# Patient Record
Sex: Male | Born: 1950
Health system: Southern US, Community
[De-identification: ages and names within clinical notes are randomized; demographics above are authoritative.]

## PROBLEM LIST (undated history)

## (undated) DIAGNOSIS — R1909 Other intra-abdominal and pelvic swelling, mass and lump: Secondary | ICD-10-CM

## (undated) DIAGNOSIS — R002 Palpitations: Secondary | ICD-10-CM

## (undated) DIAGNOSIS — Z85820 Personal history of malignant melanoma of skin: Secondary | ICD-10-CM

## (undated) DIAGNOSIS — D696 Thrombocytopenia, unspecified: Secondary | ICD-10-CM

## (undated) DIAGNOSIS — I491 Atrial premature depolarization: Secondary | ICD-10-CM

## (undated) DIAGNOSIS — K5792 Diverticulitis of intestine, part unspecified, without perforation or abscess without bleeding: Secondary | ICD-10-CM

## (undated) DIAGNOSIS — I2510 Atherosclerotic heart disease of native coronary artery without angina pectoris: Secondary | ICD-10-CM

## (undated) DIAGNOSIS — Z7960 Long term (current) use of unspecified immunomodulators and immunosuppressants: Secondary | ICD-10-CM

## (undated) DIAGNOSIS — M0579 Rheumatoid arthritis with rheumatoid factor of multiple sites without organ or systems involvement: Secondary | ICD-10-CM

## (undated) DIAGNOSIS — I4891 Unspecified atrial fibrillation: Secondary | ICD-10-CM

## (undated) DIAGNOSIS — C801 Malignant (primary) neoplasm, unspecified: Secondary | ICD-10-CM

## (undated) DIAGNOSIS — E785 Hyperlipidemia, unspecified: Secondary | ICD-10-CM

## (undated) DIAGNOSIS — R739 Hyperglycemia, unspecified: Secondary | ICD-10-CM

## (undated) DIAGNOSIS — T7840XA Allergy, unspecified, initial encounter: Secondary | ICD-10-CM

## (undated) DIAGNOSIS — Z9289 Personal history of other medical treatment: Secondary | ICD-10-CM

## (undated) DIAGNOSIS — E119 Type 2 diabetes mellitus without complications: Secondary | ICD-10-CM

## (undated) HISTORY — PX: TONSILLECTOMY: SUR1361

## (undated) HISTORY — PX: POLYPECTOMY: SHX149

## (undated) HISTORY — DX: Palpitations: R00.2

## (undated) HISTORY — DX: Personal history of other medical treatment: Z92.89

## (undated) HISTORY — DX: Diverticulitis of intestine, part unspecified, without perforation or abscess without bleeding: K57.92

## (undated) HISTORY — DX: Unspecified atrial fibrillation: I48.91

## (undated) HISTORY — PX: WISDOM TOOTH EXTRACTION: SHX21

## (undated) HISTORY — DX: Hyperlipidemia, unspecified: E78.5

## (undated) HISTORY — DX: Type 2 diabetes mellitus without complications: E11.9

## (undated) HISTORY — DX: Malignant (primary) neoplasm, unspecified: C80.1

## (undated) HISTORY — DX: Allergy, unspecified, initial encounter: T78.40XA

## (undated) HISTORY — PX: COLONOSCOPY: SHX174

## (undated) HISTORY — DX: Hyperglycemia, unspecified: R73.9

---

## 2005-05-24 ENCOUNTER — Inpatient Hospital Stay (HOSPITAL_COMMUNITY): Admission: EM | Admit: 2005-05-24 | Discharge: 2005-05-24 | Payer: Self-pay | Admitting: Emergency Medicine

## 2007-05-01 ENCOUNTER — Ambulatory Visit (HOSPITAL_BASED_OUTPATIENT_CLINIC_OR_DEPARTMENT_OTHER): Admission: RE | Admit: 2007-05-01 | Discharge: 2007-05-01 | Payer: Self-pay | Admitting: General Surgery

## 2007-05-01 ENCOUNTER — Encounter (INDEPENDENT_AMBULATORY_CARE_PROVIDER_SITE_OTHER): Payer: Self-pay | Admitting: General Surgery

## 2007-08-29 ENCOUNTER — Ambulatory Visit: Payer: Self-pay | Admitting: Internal Medicine

## 2007-09-12 ENCOUNTER — Ambulatory Visit: Payer: Self-pay | Admitting: Internal Medicine

## 2007-09-12 ENCOUNTER — Encounter: Payer: Self-pay | Admitting: Gastroenterology

## 2010-12-22 NOTE — Op Note (Signed)
NAMEDRAYKE, GRABEL              ACCOUNT NO.:  [id number]   MEDICAL RECORD NO.:  [id number]          PATIENT TYPE:  AMB   LOCATION:  DSC                          FACILITY:  [facility]   PHYSICIAN:  Gabrielle Dare. Janee Morn, M.D.DATE OF BIRTH:  [birth year]   DATE OF PROCEDURE:  05/01/2007  DATE OF DISCHARGE:                               OPERATIVE REPORT   PREOPERATIVE DIAGNOSIS:  Mass right forearm.   POSTOPERATIVE DIAGNOSIS:  Mass right forearm.   PROCEDURE:  Excision of mass right forearm.   SURGEON:  Gabrielle Dare. Janee Morn, M.D.   ANESTHESIA:  MAC.   HISTORY OF PRESENT ILLNESS:  Mr. Lindstrom is a 60 year old gentleman I  evaluated in the office initially on April 20, 2007 for a lesion on  his right forearm.  A limited incision and drainage was done, as it  appeared to be possibly an infected sebaceous cyst.  I followed him up  on April 27, 2007 for a recheck, and the lesion had actually gotten  larger, up to about 1.5 cm, from the previous week.  I was very  concerned this may represent some form of neoplasm, including the  possibility of keratoacanthoma, basil cell carcinoma, or squamous cell  carcinoma, so I scheduled him urgently for excision of this mass.   PROCEDURE IN DETAIL:  Informed consent was obtained.  The patient was  identified in the preop holding area.  His lesion was marked.  It had  actually grown to about 2 cm in size over the last 4-5 days.  He  received intravenous antibiotics.  He was brought to the operating room.  MAC anesthesia was administered by the anesthesia staff.  His right  forearm was prepped and draped in a sterile fashion.  A mixture of 1%  lidocaine with epinephrine and 0.25% Marcaine was injected around the  site, and an elliptical incision was created to facilitate closure and  completely excise this mass.  The excision was taken down to the  underlying fascia, and this was excised in one piece.  It was oriented  for pathology with a silk  suture and passed off.  The wound was  irrigated.  Hemostasis was obtained.  Small flaps were gently raised in  the subcutaneous plane medially and laterally.  The wound was again  irrigated, and after ensuring hemostasis it was closed with interrupted  4-0 nylon sutures.  It was closed without significant tension.  There  was excellent pulse and perfusion distally in the hand.  Sponge, needle,  and instrument counts were all correct. Xeroform and a sterile dressing  were applied.  The patient tolerated the procedure well, without  apparent complication, and was taken to the recovery room in stable  condition.      Gabrielle Dare Janee Morn, M.D.  Electronically Signed    BET/MEDQ  D:  05/01/2007  T:  05/02/2007  Job:  [id number]   cc:   Hal T. Stoneking, M.D.

## 2010-12-25 NOTE — H&P (Signed)
NAMEDEMETRIOUS, RAINFORD             ACCOUNT NO.:  [id number]   MEDICAL RECORD NO.:  [id number]          PATIENT TYPE:  INP   LOCATION:  1823                         FACILITY:  [facility]   PHYSICIAN:  Hal T. Stoneking, M.D. DATE OF BIRTH:  [birth year]   DATE OF ADMISSION:  05/23/2005  DATE OF DISCHARGE:                                HISTORY & PHYSICAL   IDENTIFYING DATA:  Mr. Lampkins is a very nice healthy 60 year old white  male.  He was in his usual state of health until last evening when he had  been at a church function and then to a dinner.  He felt bloated, and  eventually had an episode of very watery diarrhea, at least on 2 occasions.  He saw no blood, had no fever.  He had 3 separate syncopal episodes; one  occurred when he was driving his car.  He pulled over to the side, and in  fact scraped the bumper after the process was all over.  He came to in the  car.  He believes that he was out only a few minutes.  He felt well enough  to then get back home.  He had another episode while sitting on the commode  and felt that his head just nodded down for a few seconds.  He has had no  recent foreign travel or drinking any untreated water.  No recent  antibiotics.  He had no chest pain during any of these episodes.  No actual  vomiting, but each time he has felt nauseated and continues to feel  nauseated.  He actually had 1 syncopal episode, quite diaphoretic, in the  emergency room with his pressure dropping down to 80, although his pulse  rate remained at 60.  He has denied any headache.   PAST MEDICAL HISTORY:  Remarkable for hypercholesterolemia, quite mild.   CURRENT MEDICATIONS:  Lipitor 10 mg every other day.   PREVIOUS SURGERIES:  Right knee surgery.   FAMILY HISTORY:  Father had an MI in her 24s.  Mother had an MI in her 58s.  Brother in good health.   SOCIAL HISTORY:  He is married.  One child.  Does not smoke.  An occasional  glass of wine.  He is a retired Psychologist, clinical.   REVIEW OF SYSTEMS:  No change in vision or hearing.  No trouble chewing or  swallowing.  No musculoskeletal pain.   PHYSICAL EXAMINATION:  VITAL SIGNS:  Temperature 98, blood pressure 138/44,  pulse 72, O2 saturation 98%.  HEENT:  Pupils equal, round and reactive to light.  Disks normal.  Oropharynx normal.  NECK:  Supple.  LUNGS:  Clear.  HEART:  Regular rate and rhythm without murmurs, rubs, or gallops.  ABDOMEN:  Bowel sounds active.  Soft abdomen.  No hepatosplenomegaly or  masses palpated.  NEUROLOGIC:  He is alert, nonfocal.   LABORATORY DATA ON ADMISSION:  CK of 438 with an MB of 3.2, troponin less  than 0.05, white count 11,500, hemoglobin 15.4, platelet count 152, 89%  neutrophils, 5% lymphs, 6% monocytes.  Sodium 138, potassium 3.8, chloride  109, bicarbonate 20, glucose 161, BUN 15, creatinine 1.2, calcium 8.9, INR  1.0.   ASSESSMENT:  1.  Clinical presentation and examination most consistent with a      gastroenteritis with vasovagal syncope secondary to nausea and diarrhea.      No signs or symptoms of cardiac disease, but will monitor on telemetry      and repeat a CK-MB in the morning.  Will also repeat his white blood      cell count, which should come down fairly quickly, if this is indeed      viral.  2.  Mild elevation in glucose.  May be due to stress and IV fluids.  Will      repeat with laboratory work in the morning.  Will place him on a clear      liquid diet.   PLAN:  Will admit.  IV fluids.  P.R.N. Phenergan.  Repeat a CPK, CBC, CMET  in the morning.           ______________________________  Sunday Spillers. Pete Glatter, M.D.     HTS/MEDQ  D:  05/24/2005  T:  05/24/2005  Job:  [id number]

## 2010-12-25 NOTE — Discharge Summary (Signed)
Alfred Thomas, Alfred Thomas             ACCOUNT NO.:  [id number]   MEDICAL RECORD NO.:  [id number]          PATIENT TYPE:  INP   LOCATION:  6713                         FACILITY:  [facility]   PHYSICIAN:  Hal T. Stoneking, M.D. DATE OF BIRTH:  [birth year]   DATE OF ADMISSION:  05/23/2005  DATE OF DISCHARGE:  05/24/2005                                 DISCHARGE SUMMARY   ADMITTING DIAGNOSES:  1.  Gastroenteritis.  2.  Vasovagal syncope.  3.  Hypercholesterolemia.   DISCHARGE DIAGNOSES:  1.  Gastroenteritis.  2.  Vasovagal syncope.  3.  Hypercholesterolemia.   BRIEF HISTORY:  Mr. Mcgowan is a very nice 61 year old white male who has  really been in excellent health.  He takes Lipitor for mildly elevated  cholesterol.  On the evening of admission he felt somewhat bloated after he  had been at a dinner party.  He then had driven home, but developed  sweating, severe nausea, and then had a syncopal episode. Fortunately he had  pulled his car over to the curb.  He then returned home, had two diarrheal  stools, again felt extremely nauseated, and believed he passed out, again,  for a few seconds.  He was seen in the emergency room and had a third  episode with diffuse diaphoresis, a severe wave of nausea; and, at this  time, briefly was unresponsive with the blood pressure dropping to 80, pulse  rate 60.   PHYSICAL EXAMINATION:  VITAL SIGNS:  Blood pressure 138/44, temperature 98,  pulse 72, O2 saturation 98%.  HEENT:  Normal.  LUNGS:  Clear.  HEART:  Regular rate and rhythm without murmur.  ABDOMEN:  Bowel sounds active, nontender.  Soft, no masses.   LABORATORY DATA:  On admission, CK 438 with an MB of 3.2.  Troponin less  than 0.05.  White count elevated at 11,500 with a left shift.  Hemoglobin  15.4, sodium 138, potassium 3.8, chloride 109, BUN 15, creatinine 1.2,  glucose 161, INR 1.0.  EKG normal.   HOSPITAL COURSE:  Patient was admitted to a telemetry bed and was given  vigorous  IV fluid hydration and Phenergan p.r.n. for nausea.  Within about 8  hours he felt much better and was able to take in a liquid diet and had no  further nausea, had no further diaphoresis or syncopal episodes.  Repeat  blood work revealed his CK had come down to normal with an MB of 29,  troponin less than 0.05.  Sodium 137, potassium 3.7, chloride 108, bicarb  22, BUN 16, creatinine 1.6.  White count still elevated at 12,000.  LFTs  were normal.  Temperature 98.7, blood pressure 123/73.  His abdominal exam  was normal.  It was felt that he had either a viral or bacterial  gastroenteritis that was improving with hydration and that he had vasovagal  syncope.   PLAN:  He will restart his Lipitor 10 mg every other day. He will adhere to  a bland diet with no milk products for 24 hours.  He will use Phenergan, as  needed for the nausea.  He will be  seen back in the office later this week.  At that time we will repeat a white count.  He will call me in the meantime  if he should have any further symptoms.           ______________________________  Sunday Spillers Pete Glatter, M.D.     HTS/MEDQ  D:  05/24/2005  T:  05/24/2005  Job:  [id number]

## 2011-05-20 LAB — POCT HEMOGLOBIN-HEMACUE
Hemoglobin: 14.5
Operator id: 112821

## 2012-06-21 ENCOUNTER — Other Ambulatory Visit: Payer: Self-pay | Admitting: Dermatology

## 2012-09-06 ENCOUNTER — Encounter: Payer: Self-pay | Admitting: Internal Medicine

## 2012-12-19 ENCOUNTER — Other Ambulatory Visit: Payer: Self-pay | Admitting: Dermatology

## 2013-03-06 ENCOUNTER — Encounter: Payer: Self-pay | Admitting: Internal Medicine

## 2013-07-03 ENCOUNTER — Telehealth: Payer: Self-pay

## 2013-07-04 ENCOUNTER — Other Ambulatory Visit: Payer: Self-pay

## 2013-07-04 ENCOUNTER — Telehealth: Payer: Self-pay

## 2013-07-04 MED ORDER — DILTIAZEM HCL ER COATED BEADS 180 MG PO CP24: 180.0000 mg | ORAL_CAPSULE | Freq: Every day | ORAL | Status: DC

## 2013-07-04 NOTE — Telephone Encounter (Signed)
unable to notify pt refill for diltiazem sent to pharmacy. phone disconnected

## 2013-07-04 NOTE — Telephone Encounter (Signed)
Done

## 2013-07-06 NOTE — Telephone Encounter (Signed)
refill 

## 2013-07-12 ENCOUNTER — Other Ambulatory Visit: Payer: Self-pay | Admitting: Cardiology

## 2013-07-12 MED ORDER — DILTIAZEM HCL ER COATED BEADS 180 MG PO CP24: 180.0000 mg | ORAL_CAPSULE | Freq: Every day | ORAL | Status: DC

## 2013-07-12 NOTE — Telephone Encounter (Signed)
Rx refilled.

## 2013-11-22 ENCOUNTER — Encounter: Payer: Self-pay | Admitting: *Deleted

## 2014-03-22 ENCOUNTER — Encounter: Payer: Self-pay | Admitting: Cardiology

## 2014-03-22 ENCOUNTER — Ambulatory Visit (INDEPENDENT_AMBULATORY_CARE_PROVIDER_SITE_OTHER): Payer: Medicare PPO | Admitting: Cardiology

## 2014-03-22 VITALS — BP 110/80 | HR 65 | Ht 76.0 in | Wt 206.0 lb

## 2014-03-22 DIAGNOSIS — E7800 Pure hypercholesterolemia, unspecified: Secondary | ICD-10-CM | POA: Insufficient documentation

## 2014-03-22 DIAGNOSIS — I4891 Unspecified atrial fibrillation: Secondary | ICD-10-CM

## 2014-03-22 DIAGNOSIS — I480 Paroxysmal atrial fibrillation: Secondary | ICD-10-CM

## 2014-03-22 NOTE — Patient Instructions (Signed)
The current medical regimen is effective;  continue present plan and medications.  Follow up in 1 year with Dr Skains.  You will receive a letter in the mail 2 months before you are due.  Please call us when you receive this letter to schedule your follow up appointment.  

## 2014-03-22 NOTE — Progress Notes (Signed)
[place]. [address] [place], [place]  [address] Phone: [phone] Fax:  [phone]  Date:  03/22/2014   ID:  Alfred Thomas, DOB September 08, 1950, MRN [id number]  PCP:  Mathews Argyle, MD   History of Present Illness: Alfred Thomas is a 63 y.o. male with hyperlipidemia, paroxysmal atrial fibrillation, normal EF here for followup.   His atrial fibrillation was very briefly detected on the monitor of approximately 3 minutes duration.   As above, he usually has brief episodes. Since increasing his medication back to 180 mg a day of diltiazem from 120 mg, he has not had any episodes. He did tell me that while working out at [employer] a stranger told him that he was breathing incorrectly during weight lifting. He thinks that changing his breathing patterns is also help with any fluttering sensation that may have occurred during exercise. It is very plausible that atrial stretch may have occurred during straining.     Wt Readings from Last 3 Encounters:  03/22/14 206 lb (93.441 kg)     Past Medical History  Diagnosis Date  . Hyperlipidemia   . Hyperglycemia   . Atrial fibrillation   . H/O echocardiogram   . Diverticulitis   . Palpitations     No past surgical history on file.  Current Outpatient Prescriptions  Medication Sig Dispense Refill  . aspirin 81 MG tablet Take 81 mg by mouth daily.      . benzonatate (TESSALON) 200 MG capsule       . diltiazem (CARDIZEM CD) 180 MG 24 hr capsule Take 1 capsule (180 mg total) by mouth daily.  90 capsule  2  . fluticasone (FLONASE) 50 MCG/ACT nasal spray       . Loratadine (CLARITIN PO) Take by mouth daily.      . pravastatin (PRAVACHOL) 40 MG tablet Take 40 mg by mouth daily.      Marland Kitchen scopolamine (TRANSDERM-SCOP) 1 MG/3DAYS Place 1 patch onto the skin every 3 (three) days.       No current facility-administered medications for this visit.    Allergies:    Allergies  Allergen Reactions  . Crestor [Rosuvastatin]   .  Lipitor [Atorvastatin]     Social History:  The patient  reports that he has never smoked. He does not have any smokeless tobacco history on file. He reports that he drinks alcohol. He reports that he does not use illicit drugs.   Family History  Problem Relation Age of Onset  . Coronary artery disease Mother   . Heart attack Mother    Mother died 12-19-2022 December 04, 1989 CABG pacer.   ROS:  Please see the history of present illness.   No fevers, no chills, no orthopnea, no PND   All other systems reviewed and negative.   PHYSICAL EXAM: VS:  BP 110/80  Pulse 65  Ht 6\' 4"  (1.93 m)  Wt 206 lb (93.441 kg)  BMI 25.09 kg/m2 Well nourished, well developed, in no acute distress HEENT: normal, [place]/AT, EOMI Neck: no JVD, normal carotid upstroke, no bruit Cardiac:  normal S1, S2; RRR; no murmur Lungs:  clear to auscultation bilaterally, no wheezing, rhonchi or rales Abd: soft, nontender, no hepatomegaly, no bruits Ext: no edema, 2+ distal pulses Skin: warm and dry GU: deferred Neuro: no focal abnormalities noted, AAO x 3  EKG:  Sinus rhythm, 65, PVC, no other abnormalities.  LABS: 2022/12/19 -  LDL 143    ASSESSMENT AND PLAN:  1. PAF - doing well. Diltiazem working well. He has not had any recent episodes. CHADS-VAS - 1 -age. 2. Hyperlipidemia - pravastatin QOD.  3. He was very thankful for hospice, taking care of his 32 year old mother.  Signed, Candee Furbish, MD [facility]  03/22/2014 4:05 PM

## 2014-04-25 ENCOUNTER — Other Ambulatory Visit: Payer: Self-pay | Admitting: *Deleted

## 2014-04-25 MED ORDER — DILTIAZEM HCL ER COATED BEADS 180 MG PO CP24: 180.0000 mg | ORAL_CAPSULE | Freq: Every day | ORAL | Status: DC

## 2014-08-07 ENCOUNTER — Encounter: Payer: Self-pay | Admitting: Internal Medicine

## 2014-09-27 ENCOUNTER — Ambulatory Visit (AMBULATORY_SURGERY_CENTER): Payer: Self-pay | Admitting: *Deleted

## 2014-09-27 VITALS — Ht 76.0 in | Wt 215.6 lb

## 2014-09-27 DIAGNOSIS — Z86010 Personal history of colonic polyps: Secondary | ICD-10-CM

## 2014-09-27 MED ORDER — MOVIPREP 100 G PO SOLR: 1.0000 | Freq: Once | ORAL | Status: DC

## 2014-09-27 NOTE — Progress Notes (Signed)
No egg or soy allergy No diet pills No home 02 use No issues with past sedation Pt declined emmi PT states a fib under control with meds.

## 2014-10-11 ENCOUNTER — Ambulatory Visit (AMBULATORY_SURGERY_CENTER): Payer: Medicare PPO | Admitting: Internal Medicine

## 2014-10-11 ENCOUNTER — Encounter: Payer: Self-pay | Admitting: Internal Medicine

## 2014-10-11 VITALS — BP 137/92 | HR 57 | Temp 96.8°F | Resp 19 | Ht 76.0 in | Wt 215.0 lb

## 2014-10-11 DIAGNOSIS — D123 Benign neoplasm of transverse colon: Secondary | ICD-10-CM

## 2014-10-11 DIAGNOSIS — Z86010 Personal history of colonic polyps: Secondary | ICD-10-CM

## 2014-10-11 MED ORDER — SODIUM CHLORIDE 0.9 % IV SOLN
500.0000 mL | INTRAVENOUS | Status: DC
Start: 2014-10-11 — End: 2014-10-11

## 2014-10-11 NOTE — Op Note (Signed)
[name]. [place], [id number]   COLONOSCOPY PROCEDURE REPORT  PATIENT: Alfred Thomas, Alfred Thomas  MR#: [id number] BIRTHDATE: 1951-04-06 , 14  yrs. old GENDER: male ENDOSCOPIST: Eustace Quail, MD REFERRED YT:RZNBVAPOLIDC Program Recall PROCEDURE DATE:  10/11/2014 PROCEDURE:   Colonoscopy with snare polypectomy x 1 First Screening Colonoscopy - Avg.  risk and is 50 yrs.  old or older - No.  Prior Negative Screening - Now for repeat screening. N/A  History of Adenoma - Now for follow-up colonoscopy & has been > or = to 3 yrs.  Yes hx of adenoma.  Has been 3 or more years since last colonoscopy.  Polyps Removed Today? Yes. ASA CLASS:   Class II INDICATIONS:follow up of adenomatous colonic polyp(s). Index 2004 (hpp); 2009 (TA) MEDICATIONS: Monitored anesthesia care and Propofol 240 mg IV  DESCRIPTION OF PROCEDURE:   After the risks benefits and alternatives of the procedure were thoroughly explained, informed consent was obtained.  The digital rectal exam revealed no abnormalities of the rectum.   The LB [id number]  endoscope was introduced through the anus and advanced to the cecum, which was identified by both the appendix and ileocecal valve. No adverse events experienced.   The quality of the prep was excellent, using MoviPrep  The instrument was then slowly withdrawn as the colon was fully examined.  COLON FINDINGS: A single polyp measuring 5 mm in size was found in the transverse colon.  A polypectomy was performed with a cold snare.  The resection was complete, the polyp tissue was completely retrieved and sent to histology.   There was moderate diverticulosis noted in the left colon.   The examination was otherwise normal.  Retroflexed views revealed internal hemorrhoids. The time to cecum=2 minutes 53 seconds.  Withdrawal time=13 minutes 0 seconds.  The scope was withdrawn and the procedure completed. COMPLICATIONS: There were no immediate  complications.  ENDOSCOPIC IMPRESSION: 1.   Single polyp measuring 5 mm in size was found in the transverse colon; polypectomy was performed with a cold snare 2.   Moderate diverticulosis was noted in the left colon 3.   The examination was otherwise normal  RECOMMENDATIONS: 1. Follow up colonoscopy in 5 years  eSigned:  Eustace Quail, MD 10/11/2014 8:43 AM   cc: Lajean Manes, MD and The Patient

## 2014-10-11 NOTE — Progress Notes (Signed)
Called to room to assist during endoscopic procedure.  Patient ID and intended procedure confirmed with present staff. Received instructions for my participation in the procedure from the performing physician.  

## 2014-10-11 NOTE — Progress Notes (Signed)
To recovery, report to [facility], Therapist, sports. VSS

## 2014-10-11 NOTE — Patient Instructions (Signed)
Discharge instructions given. Handouts on polyps and diverticulosis. Resume previous medications. YOU HAD AN ENDOSCOPIC PROCEDURE TODAY AT THE [facility]:   Refer to the procedure report that was given to you for any specific questions about what was found during the examination.  If the procedure report does not answer your questions, please call your gastroenterologist to clarify.  If you requested that your care partner not be given the details of your procedure findings, then the procedure report has been included in a sealed envelope for you to review at your convenience later.  YOU SHOULD EXPECT: Some feelings of bloating in the abdomen. Passage of more gas than usual.  Walking can help get rid of the air that was put into your GI tract during the procedure and reduce the bloating. If you had a lower endoscopy (such as a colonoscopy or flexible sigmoidoscopy) you may notice spotting of blood in your stool or on the toilet paper. If you underwent a bowel prep for your procedure, you may not have a normal bowel movement for a few days.  Please Note:  You might notice some irritation and congestion in your nose or some drainage.  This is from the oxygen used during your procedure.  There is no need for concern and it should clear up in a day or so.  SYMPTOMS TO REPORT IMMEDIATELY:   Following lower endoscopy (colonoscopy or flexible sigmoidoscopy):  Excessive amounts of blood in the stool  Significant tenderness or worsening of abdominal pains  Swelling of the abdomen that is new, acute  Fever of 100F or higher   For urgent or emergent issues, a gastroenterologist can be reached at any hour by calling [phone].   DIET: Your first meal following the procedure should be a small meal and then it is ok to progress to your normal diet. Heavy or fried foods are harder to digest and may make you feel nauseous or bloated.  Likewise, meals heavy in dairy and vegetables can  increase bloating.  Drink plenty of fluids but you should avoid alcoholic beverages for 24 hours.  ACTIVITY:  You should plan to take it easy for the rest of today and you should NOT DRIVE or use heavy machinery until tomorrow (because of the sedation medicines used during the test).    FOLLOW UP: Our staff will call the number listed on your records the next business day following your procedure to check on you and address any questions or concerns that you may have regarding the information given to you following your procedure. If we do not reach you, we will leave a message.  However, if you are feeling well and you are not experiencing any problems, there is no need to return our call.  We will assume that you have returned to your regular daily activities without incident.  If any biopsies were taken you will be contacted by phone or by letter within the next 1-3 weeks.  Please call us at [phone] if you have not heard about the biopsies in 3 weeks.    SIGNATURES/CONFIDENTIALITY: You and/or your care partner have signed paperwork which will be entered into your electronic medical record.  These signatures attest to the fact that that the information above on your After Visit Summary has been reviewed and is understood.  Full responsibility of the confidentiality of this discharge information lies with you and/or your care-partner. 

## 2014-10-14 ENCOUNTER — Telehealth: Payer: Self-pay

## 2014-10-14 NOTE — Telephone Encounter (Signed)
  Follow up Call-  Call back number 10/11/2014  Post procedure Call Back phone  # [phone]  Permission to leave phone message Yes     Patient questions:  Do you have a fever, pain , or abdominal swelling? No. Pain Score  0 *  Have you tolerated food without any problems? Yes.    Have you been able to return to your normal activities? Yes.    Do you have any questions about your discharge instructions: Diet   No. Medications  No. Follow up visit  No.  Do you have questions or concerns about your Care? No.  Actions: * If pain score is 4 or above: No action needed, pain <4.

## 2014-10-15 ENCOUNTER — Encounter: Payer: Self-pay | Admitting: Internal Medicine

## 2015-02-19 DIAGNOSIS — L570 Actinic keratosis: Secondary | ICD-10-CM | POA: Diagnosis not present

## 2015-02-19 DIAGNOSIS — C44712 Basal cell carcinoma of skin of right lower limb, including hip: Secondary | ICD-10-CM | POA: Diagnosis not present

## 2015-02-19 DIAGNOSIS — B356 Tinea cruris: Secondary | ICD-10-CM | POA: Diagnosis not present

## 2015-02-19 DIAGNOSIS — Z85828 Personal history of other malignant neoplasm of skin: Secondary | ICD-10-CM | POA: Diagnosis not present

## 2015-02-19 DIAGNOSIS — L821 Other seborrheic keratosis: Secondary | ICD-10-CM | POA: Diagnosis not present

## 2015-02-19 DIAGNOSIS — D485 Neoplasm of uncertain behavior of skin: Secondary | ICD-10-CM | POA: Diagnosis not present

## 2015-02-19 DIAGNOSIS — Z85820 Personal history of malignant melanoma of skin: Secondary | ICD-10-CM | POA: Diagnosis not present

## 2015-03-23 ENCOUNTER — Other Ambulatory Visit: Payer: Self-pay | Admitting: Cardiology

## 2015-03-26 ENCOUNTER — Other Ambulatory Visit: Payer: Self-pay | Admitting: Cardiology

## 2015-06-23 ENCOUNTER — Other Ambulatory Visit: Payer: Self-pay | Admitting: Cardiology

## 2015-06-26 ENCOUNTER — Other Ambulatory Visit: Payer: Self-pay | Admitting: Cardiology

## 2015-07-30 ENCOUNTER — Other Ambulatory Visit: Payer: Self-pay | Admitting: Cardiology

## 2015-08-12 ENCOUNTER — Emergency Department (HOSPITAL_COMMUNITY): Payer: Medicare Other

## 2015-08-12 ENCOUNTER — Emergency Department (HOSPITAL_COMMUNITY)
Admission: EM | Admit: 2015-08-12 | Discharge: 2015-08-12 | Disposition: A | Discharge status: Home / Self Care | Payer: Medicare Other | Attending: Emergency Medicine | Admitting: Emergency Medicine

## 2015-08-12 ENCOUNTER — Encounter (HOSPITAL_COMMUNITY): Payer: Self-pay | Admitting: Family Medicine

## 2015-08-12 DIAGNOSIS — E785 Hyperlipidemia, unspecified: Secondary | ICD-10-CM | POA: Insufficient documentation

## 2015-08-12 DIAGNOSIS — Z8719 Personal history of other diseases of the digestive system: Secondary | ICD-10-CM | POA: Insufficient documentation

## 2015-08-12 DIAGNOSIS — R110 Nausea: Secondary | ICD-10-CM | POA: Insufficient documentation

## 2015-08-12 DIAGNOSIS — R42 Dizziness and giddiness: Secondary | ICD-10-CM | POA: Diagnosis not present

## 2015-08-12 DIAGNOSIS — R61 Generalized hyperhidrosis: Secondary | ICD-10-CM | POA: Insufficient documentation

## 2015-08-12 DIAGNOSIS — Z7982 Long term (current) use of aspirin: Secondary | ICD-10-CM | POA: Diagnosis not present

## 2015-08-12 DIAGNOSIS — Z859 Personal history of malignant neoplasm, unspecified: Secondary | ICD-10-CM | POA: Insufficient documentation

## 2015-08-12 DIAGNOSIS — R197 Diarrhea, unspecified: Secondary | ICD-10-CM | POA: Diagnosis not present

## 2015-08-12 DIAGNOSIS — Z8679 Personal history of other diseases of the circulatory system: Secondary | ICD-10-CM | POA: Insufficient documentation

## 2015-08-12 DIAGNOSIS — Z79899 Other long term (current) drug therapy: Secondary | ICD-10-CM | POA: Insufficient documentation

## 2015-08-12 DIAGNOSIS — R079 Chest pain, unspecified: Secondary | ICD-10-CM | POA: Diagnosis present

## 2015-08-12 LAB — BASIC METABOLIC PANEL
ANION GAP: 9 (ref 5–15)
BUN: 15 mg/dL (ref 6–20)
CHLORIDE: 106 mmol/L (ref 101–111)
CO2: 23 mmol/L (ref 22–32)
CREATININE: 1.06 mg/dL (ref 0.61–1.24)
Calcium: 9.1 mg/dL (ref 8.9–10.3)
GFR calc non Af Amer: >60 mL/min (ref >60)
Glucose, Bld: 133 mg/dL — ABNORMAL HIGH (ref 65–99)
POTASSIUM: 4.3 mmol/L (ref 3.5–5.1)
SODIUM: 138 mmol/L (ref 135–145)

## 2015-08-12 LAB — CBC
HCT: 47.2 % (ref 39.0–52.0)
HEMOGLOBIN: 15.8 g/dL (ref 13.0–17.0)
MCH: 30.3 pg (ref 26.0–34.0)
MCHC: 33.5 g/dL (ref 30.0–36.0)
MCV: 90.4 fL (ref 78.0–100.0)
PLATELETS: 163 10*3/uL (ref 150–400)
RBC: 5.22 MIL/uL (ref 4.22–5.81)
RDW: 13.1 % (ref 11.5–15.5)
WBC: 15.9 10*3/uL — AB (ref 4.0–10.5)

## 2015-08-12 LAB — I-STAT TROPONIN, ED
TROPONIN I, POC: 0 ng/mL (ref 0.00–0.08)
Troponin i, poc: 0 ng/mL (ref 0.00–0.08)

## 2015-08-12 MED ORDER — METOCLOPRAMIDE HCL 5 MG/ML IJ SOLN
5.0000 mg | Freq: Once | INTRAMUSCULAR | Status: AC
  Administered 2015-08-12: 5 mg via INTRAVENOUS
  Filled 2015-08-12: qty 2

## 2015-08-12 MED ORDER — DIPHENHYDRAMINE HCL 25 MG PO CAPS
25.0000 mg | ORAL_CAPSULE | Freq: Once | ORAL | Status: AC
  Administered 2015-08-12: 25 mg via ORAL
  Filled 2015-08-12: qty 1

## 2015-08-12 MED ORDER — ALUM & MAG HYDROXIDE-SIMETH 200-200-20 MG/5ML PO SUSP
15.0000 mL | Freq: Once | ORAL | Status: AC
  Administered 2015-08-12: 15 mL via ORAL
  Filled 2015-08-12: qty 30

## 2015-08-12 MED ORDER — LIDOCAINE VISCOUS 2 % MT SOLN
15.0000 mL | Freq: Once | OROMUCOSAL | Status: AC
  Administered 2015-08-12: 15 mL via OROMUCOSAL
  Filled 2015-08-12: qty 15

## 2015-08-12 MED ORDER — ONDANSETRON 4 MG PO TBDP: ORAL_TABLET | ORAL | Status: DC

## 2015-08-12 MED ORDER — SODIUM CHLORIDE 0.9 % IV BOLUS (SEPSIS)
1000.0000 mL | Freq: Once | INTRAVENOUS | Status: AC
  Administered 2015-08-12: 1000 mL via INTRAVENOUS

## 2015-08-12 MED ORDER — ONDANSETRON HCL 4 MG/2ML IJ SOLN
4.0000 mg | Freq: Once | INTRAMUSCULAR | Status: AC
  Administered 2015-08-12: 4 mg via INTRAVENOUS
  Filled 2015-08-12: qty 2

## 2015-08-12 NOTE — ED Notes (Signed)
MD at bedside. 

## 2015-08-12 NOTE — ED Notes (Signed)
Patient reports he woke up with being nausea, mid-sternal chest pain. Pt took ASA 81mg  when the chest pain started.

## 2015-08-12 NOTE — ED Notes (Signed)
Order for Istat to be drawn at [facility]

## 2015-08-12 NOTE — ED Provider Notes (Signed)
CSN: [id number]     Arrival date & time 08/12/15  0316 History  By signing my name below, I, Hansel Feinstein, attest that this documentation has been prepared under the direction and in the presence of Deno Etienne, DO. Electronically Signed: Hansel Feinstein, ED Scribe. 08/12/2015. 4:04 AM.   Chief Complaint  Patient presents with  . Chest Pain   Patient is a 65 y.o. male presenting with chest pain. The history is provided by the patient. No language interpreter was used.  Chest Pain Pain location:  Substernal area Pain quality: dull   Pain radiates to:  Does not radiate Pain radiates to the back: no   Pain severity:  Mild Onset quality:  Sudden Duration:  2 minutes Timing:  Intermittent Progression:  Improving Chronicity:  Recurrent Context: at rest   Context: no movement and no trauma   Relieved by:  None tried Worsened by:  Nothing tried Ineffective treatments:  None tried Associated symptoms: diaphoresis and nausea   Associated symptoms: no abdominal pain, no fever, no headache, no palpitations, no shortness of breath and not vomiting   Risk factors: high cholesterol     HPI Comments: Alfred Thomas. is a 65 y.o. male with h/o HLD, Afib, palpitations who presents to the Emergency Department complaining of multiple episodes, lasting minutes at a time, of sudden onset nausea, transient dull CP, transient lightheadedness, transient diaphoresis and retching that began tonight. Pt reports that he woke up with his symptoms. He notes that his symptoms are not exertional. Per pt, he is not sure what triggers the episodes of nausea, diaphoresis, lightheadedness and retching. He also notes an episode of loose stool tonight. He reports that he currently feels well and his symptoms have mostly resolved. Pt states h/o similar symptoms with syncope, with dx of food poisoning. Pt denies having any sick contacts with similar symptoms, recent suspicious food intake, abdominal surgeries, recent travel  outside the country. He denies emesis, syncope, abdominal pain. Pt is followed by Cardiology. No h/o MI, CAD/CHF, cardiac catheterization. H/o cardiac stress test with normal findings.     Past Medical History  Diagnosis Date  . Hyperlipidemia   . Hyperglycemia   . Atrial fibrillation ([place])   . H/O echocardiogram   . Diverticulitis   . Palpitations   . Cancer [facility])     melanoma skin cancer shoulder   Past Surgical History  Procedure Laterality Date  . Colonoscopy      x2  . Polypectomy    . Wisdom tooth extraction    . Tonsillectomy     Family History  Problem Relation Age of Onset  . Coronary artery disease Mother   . Heart attack Mother   . Heart disease Mother   . Heart disease Father   . Colon cancer Neg Hx   . Emphysema Father    Social History  Substance Use Topics  . Smoking status: Never Smoker   . Smokeless tobacco: Never Used  . Alcohol Use: 0.0 oz/week    0 Standard drinks or equivalent per week     Comment: socially    Review of Systems  Constitutional: Positive for diaphoresis. Negative for fever and chills.  HENT: Negative for congestion and facial swelling.   Eyes: Negative for discharge and visual disturbance.  Respiratory: Negative for shortness of breath.   Cardiovascular: Positive for chest pain. Negative for palpitations.  Gastrointestinal: Positive for nausea and diarrhea. Negative for vomiting and abdominal pain.  Musculoskeletal: Negative for myalgias and  arthralgias.  Skin: Negative for color change and rash.  Neurological: Positive for light-headedness. Negative for tremors, syncope and headaches.  Psychiatric/Behavioral: Negative for confusion and dysphoric mood.  All other systems reviewed and are negative.  Allergies  Crestor and Lipitor  Home Medications   Prior to Admission medications   Medication Sig Start Date End Date Taking? Authorizing Provider  aspirin EC 81 MG tablet Take 81 mg by mouth daily.   Yes Historical Provider,  MD  diltiazem (CARDIZEM CD) 180 MG 24 hr capsule TAKE 1 CAPSULE BY MOUTH ONCE DAILY 07/30/15  Yes Jerline Pain, MD  fluticasone (FLONASE) 50 MCG/ACT nasal spray Place 1-2 sprays into both nostrils daily as needed.  01/24/14  Yes Historical Provider, MD  Loratadine (CLARITIN PO) Take 10 mg by mouth daily.    Yes Historical Provider, MD  naproxen sodium (ANAPROX) 220 MG tablet Take 220 mg by mouth 2 (two) times daily as needed (for joint pain/stiffness.).    Yes Historical Provider, MD  pravastatin (PRAVACHOL) 40 MG tablet Take 40 mg by mouth daily.   Yes Historical Provider, MD  ondansetron (ZOFRAN ODT) 4 MG disintegrating tablet 4mg  ODT q4 hours prn nausea/vomit 08/12/15   Deno Etienne, DO   BP 102/63 mmHg  Pulse 74  Temp(Src) 97.6 F (36.4 C) (Oral)  Resp 22  Ht 6\' 4"  (1.93 m)  Wt 210 lb (95.255 kg)  BMI 25.57 kg/m2  SpO2 95% Physical Exam  Constitutional: He is oriented to person, place, and time. He appears well-developed and well-nourished.  HENT:  Head: Normocephalic and atraumatic.  Eyes: Conjunctivae and EOM are normal. Pupils are equal, round, and reactive to light.  Neck: Normal range of motion. Neck supple.  Cardiovascular: Normal rate, regular rhythm and normal heart sounds.  Exam reveals no gallop and no friction rub.   No murmur heard. Equal UE pulses. RRR  Pulmonary/Chest: Effort normal and breath sounds normal. No respiratory distress. He has no wheezes. He has no rales. He exhibits no tenderness.  Lungs CTA bilaterally.   Abdominal: He exhibits no distension. There is no tenderness. There is no rebound.  No abdominal TTP.   Musculoskeletal: Normal range of motion.  Neurological: He is alert and oriented to person, place, and time.  Skin: Skin is warm and dry.  Psychiatric: He has a normal mood and affect. His behavior is normal.  Nursing note and vitals reviewed.   ED Course  Procedures (including critical care time) DIAGNOSTIC STUDIES: Oxygen Saturation is 96% on  RA, adequate by my interpretation.    COORDINATION OF CARE: 4:00 AM Discussed treatment plan with pt at bedside and pt agreed to plan.   Labs Review Labs Reviewed  BASIC METABOLIC PANEL - Abnormal; Notable for the following:    Glucose, Bld 133 (*)    All other components within normal limits  CBC - Abnormal; Notable for the following:    WBC 15.9 (*)    All other components within normal limits  I-STAT TROPOININ, ED  Randolm Idol, ED    Imaging Review Dg Chest 2 View  08/12/2015  CLINICAL DATA:  Acute onset of substernal chest pain and nausea. Initial encounter. EXAM: CHEST  2 VIEW COMPARISON:  Chest radiograph from 05/24/2005 FINDINGS: The lungs are well-aerated and clear. There is no evidence of focal opacification, pleural effusion or pneumothorax. The heart is normal in size; the mediastinal contour is within normal limits. No acute osseous abnormalities are seen. IMPRESSION: No acute cardiopulmonary process seen. Electronically Signed  By: Garald Balding M.D.   On: 08/12/2015 03:57   I have personally reviewed and evaluated these images and lab results as part of my medical decision-making.   EKG Interpretation   Date/Time:  Tuesday August 12 2015 03:24:01 EST Ventricular Rate:  80 PR Interval:  141 QRS Duration: 105 QT Interval:  382 QTC Calculation: 441 R Axis:   84 Text Interpretation:  Sinus rhythm Borderline right axis deviation Minimal  ST depression, inferior leads No significant change since last tracing  Confirmed by Cliford Sequeira MD, DANIEL [id number]) on 08/12/2015 3:28:24 AM      MDM   Final diagnoses:  Nausea    65 yo M with a cc of diaphoresis, chest pressure, near syncope.  This has been occurring in waves and the patient has a sensation like he needs to vomit but has not yet vomited. One loose stool. Denies strange food or water sources. Denies sick contacts. Likely GI by history. Patient with no coronary artery disease. Will obtain a delta troponin second to  patient age and risk factors. Initial troponin negative.  Delta negative, patient with continues waves of nausea.  Repeat abdominal exam benign.     I have discussed the diagnosis/risks/treatment options with the patient and family and believe the pt to be eligible for discharge home to follow-up with PCP. We also discussed returning to the ED immediately if new or worsening sx occur. We discussed the sx which are most concerning (e.g., sudden worsening pain, fever, inability to tolerate by mouth) that necessitate immediate return. Medications administered to the patient during their visit and any new prescriptions provided to the patient are listed below.  Medications given during this visit Medications  sodium chloride 0.9 % bolus 1,000 mL (0 mLs Intravenous Stopped 08/12/15 0500)  ondansetron (ZOFRAN) injection 4 mg (4 mg Intravenous Given 08/12/15 0422)  alum & mag hydroxide-simeth (MAALOX/MYLANTA) 200-200-20 MG/5ML suspension 15 mL (15 mLs Oral Given 08/12/15 0424)  lidocaine (XYLOCAINE) 2 % viscous mouth solution 15 mL (15 mLs Mouth/Throat Given 08/12/15 0424)  metoCLOPramide (REGLAN) injection 5 mg (5 mg Intravenous Given 08/12/15 0636)  diphenhydrAMINE (BENADRYL) capsule 25 mg (25 mg Oral Given 08/12/15 0636)    Discharge Medication List as of 08/12/2015  6:33 AM    START taking these medications   Details  ondansetron (ZOFRAN ODT) 4 MG disintegrating tablet 4mg  ODT q4 hours prn nausea/vomit, Print        The patient appears reasonably screen and/or stabilized for discharge and I doubt any other medical condition or other [facility] requiring further screening, evaluation, or treatment in the ED at this time prior to discharge.   I personally performed the services described in this documentation, which was scribed in my presence. The recorded information has been reviewed and is accurate.     Deno Etienne, DO 08/12/15 1747

## 2015-08-12 NOTE — Discharge Instructions (Signed)
Nausea and Vomiting  Nausea means you feel sick to your stomach. Throwing up (vomiting) is a reflex where stomach contents come out of your mouth.  HOME CARE   · Take medicine as told by your doctor.  · Do not force yourself to eat. However, you do need to drink fluids.  · If you feel like eating, eat a normal diet as told by your doctor.    Eat rice, wheat, potatoes, bread, lean meats, yogurt, fruits, and vegetables.    Avoid high-fat foods.  · Drink enough fluids to keep your pee (urine) clear or pale yellow.  · Ask your doctor how to replace body fluid losses (rehydrate). Signs of body fluid loss (dehydration) include:    Feeling very thirsty.    Dry lips and mouth.    Feeling dizzy.    Dark pee.    Peeing less than normal.    Feeling confused.    Fast breathing or heart rate.  GET HELP RIGHT AWAY IF:   · You have blood in your throw up.  · You have black or bloody poop (stool).  · You have a bad headache or stiff neck.  · You feel confused.  · You have bad belly (abdominal) pain.  · You have chest pain or trouble breathing.  · You do not pee at least once every 8 hours.  · You have cold, clammy skin.  · You keep throwing up after 24 to 48 hours.  · You have a fever.  MAKE SURE YOU:   · Understand these instructions.  · Will watch your condition.  · Will get help right away if you are not doing well or get worse.     This information is not intended to replace advice given to you by your health care provider. Make sure you discuss any questions you have with your health care provider.     Document Released: 01/12/2008 Document Revised: 10/18/2011 Document Reviewed: 12/25/2010  Elsevier Interactive Patient Education ©2016 [employer] Inc[employer]

## 2015-08-13 ENCOUNTER — Emergency Department (HOSPITAL_COMMUNITY)
Admission: EM | Admit: 2015-08-13 | Discharge: 2015-08-13 | Disposition: A | Discharge status: Home / Self Care | Payer: Medicare Other | Attending: Emergency Medicine | Admitting: Emergency Medicine

## 2015-08-13 ENCOUNTER — Emergency Department (HOSPITAL_COMMUNITY): Payer: Medicare Other

## 2015-08-13 ENCOUNTER — Encounter (HOSPITAL_COMMUNITY): Payer: Self-pay | Admitting: Family Medicine

## 2015-08-13 DIAGNOSIS — E785 Hyperlipidemia, unspecified: Secondary | ICD-10-CM | POA: Diagnosis not present

## 2015-08-13 DIAGNOSIS — I480 Paroxysmal atrial fibrillation: Secondary | ICD-10-CM | POA: Insufficient documentation

## 2015-08-13 DIAGNOSIS — Z8719 Personal history of other diseases of the digestive system: Secondary | ICD-10-CM | POA: Insufficient documentation

## 2015-08-13 DIAGNOSIS — Z85828 Personal history of other malignant neoplasm of skin: Secondary | ICD-10-CM | POA: Diagnosis not present

## 2015-08-13 DIAGNOSIS — Z79899 Other long term (current) drug therapy: Secondary | ICD-10-CM | POA: Diagnosis not present

## 2015-08-13 DIAGNOSIS — Z7984 Long term (current) use of oral hypoglycemic drugs: Secondary | ICD-10-CM | POA: Diagnosis not present

## 2015-08-13 DIAGNOSIS — Z9289 Personal history of other medical treatment: Secondary | ICD-10-CM | POA: Insufficient documentation

## 2015-08-13 DIAGNOSIS — I4891 Unspecified atrial fibrillation: Secondary | ICD-10-CM | POA: Diagnosis present

## 2015-08-13 LAB — CBC
HCT: 45.2 % (ref 39.0–52.0)
HEMOGLOBIN: 15 g/dL (ref 13.0–17.0)
MCH: 29.5 pg (ref 26.0–34.0)
MCHC: 33.2 g/dL (ref 30.0–36.0)
MCV: 89 fL (ref 78.0–100.0)
PLATELETS: 136 10*3/uL — AB (ref 150–400)
RBC: 5.08 MIL/uL (ref 4.22–5.81)
RDW: 13 % (ref 11.5–15.5)
WBC: 6.8 10*3/uL (ref 4.0–10.5)

## 2015-08-13 LAB — URINE MICROSCOPIC-ADD ON

## 2015-08-13 LAB — BASIC METABOLIC PANEL
ANION GAP: 10 (ref 5–15)
BUN: 10 mg/dL (ref 6–20)
CALCIUM: 8.9 mg/dL (ref 8.9–10.3)
CO2: 22 mmol/L (ref 22–32)
CREATININE: 0.89 mg/dL (ref 0.61–1.24)
Chloride: 106 mmol/L (ref 101–111)
GLUCOSE: 109 mg/dL — AB (ref 65–99)
Potassium: 3.8 mmol/L (ref 3.5–5.1)
Sodium: 138 mmol/L (ref 135–145)

## 2015-08-13 LAB — I-STAT TROPONIN, ED: TROPONIN I, POC: 0 ng/mL (ref 0.00–0.08)

## 2015-08-13 LAB — URINALYSIS, ROUTINE W REFLEX MICROSCOPIC
BILIRUBIN URINE: NEGATIVE
GLUCOSE, UA: NEGATIVE mg/dL
KETONES UR: 15 mg/dL — AB
LEUKOCYTES UA: NEGATIVE
Nitrite: NEGATIVE
PROTEIN: NEGATIVE mg/dL
Specific Gravity, Urine: 1.024 (ref 1.005–1.030)
pH: 5.5 (ref 5.0–8.0)

## 2015-08-13 MED ORDER — SODIUM CHLORIDE 0.9 % IV BOLUS (SEPSIS)
500.0000 mL | Freq: Once | INTRAVENOUS | Status: AC
  Administered 2015-08-13: 500 mL via INTRAVENOUS

## 2015-08-13 NOTE — ED Provider Notes (Signed)
CSN: [id number]     Arrival date & time 08/13/15  1540 History   First MD Initiated Contact with Patient 08/13/15 2034     Chief Complaint  Patient presents with  . Atrial Fibrillation     (Consider location/radiation/quality/duration/timing/severity/associated sxs/prior Treatment) HPI Patient with history of proximal edge fibrillation on no anti-Coagulation of and aspirin presents with atrial fibrillation. Patient was seen in emergency department several nights ago for nausea, chest tightness and loose stool. Thought to be possibly GI in origin. Discharged home to follow up primary doctor. Saw Dr. Felipa Eth today. In Dr. Carlyle Lipa office patient was borderline hypotensive with EKG that showed atrial fibrillation with RVR into the 130s. Patient states he is feeling much better. He denies any shortness of breath or chest pain. He denies any nausea or vomiting. He has had one loose stool today. No abdominal pain. States she has missed 2 days of his Cardizem. Past Medical History  Diagnosis Date  . Hyperlipidemia   . Hyperglycemia   . Atrial fibrillation ([place])   . H/O echocardiogram   . Diverticulitis   . Palpitations   . Cancer [facility])     melanoma skin cancer shoulder   Past Surgical History  Procedure Laterality Date  . Colonoscopy      x2  . Polypectomy    . Wisdom tooth extraction    . Tonsillectomy     Family History  Problem Relation Age of Onset  . Coronary artery disease Mother   . Heart attack Mother   . Heart disease Mother   . Heart disease Father   . Colon cancer Neg Hx   . Emphysema Father    Social History  Substance Use Topics  . Smoking status: Never Smoker   . Smokeless tobacco: Never Used  . Alcohol Use: 0.0 oz/week    0 Standard drinks or equivalent per week     Comment: socially    Review of Systems  Constitutional: Negative for fever and chills.  Respiratory: Negative for shortness of breath.   Cardiovascular: Negative for chest pain, palpitations  and leg swelling.  Gastrointestinal: Negative for nausea, vomiting, abdominal pain and diarrhea.  Musculoskeletal: Negative for back pain, neck pain and neck stiffness.  Skin: Negative for rash and wound.  Neurological: Negative for dizziness, weakness, light-headedness, numbness and headaches.  All other systems reviewed and are negative.     Allergies  Crestor and Lipitor  Home Medications   Prior to Admission medications   Medication Sig Start Date End Date Taking? Authorizing Provider  aspirin EC 81 MG tablet Take 81 mg by mouth daily.   Yes Historical Provider, MD  diltiazem (CARDIZEM CD) 180 MG 24 hr capsule TAKE 1 CAPSULE BY MOUTH ONCE DAILY 07/30/15  Yes Jerline Pain, MD  loratadine (CLARITIN) 10 MG tablet Take 10 mg by mouth daily.   Yes Historical Provider, MD  naproxen sodium (ANAPROX) 220 MG tablet Take 220 mg by mouth 2 (two) times daily as needed (for joint pain/stiffness.).    Yes Historical Provider, MD  pravastatin (PRAVACHOL) 40 MG tablet Take 40 mg by mouth every evening.    Yes Historical Provider, MD  ondansetron (ZOFRAN ODT) 4 MG disintegrating tablet 4mg  ODT q4 hours prn nausea/vomit 08/12/15   Deno Etienne, DO   BP 155/95 mmHg  Pulse 74  Temp(Src) 98.6 F (37 C) (Oral)  Resp 12  SpO2 97% Physical Exam  Constitutional: He is oriented to person, place, and time. He appears well-developed and well-nourished.  No distress.  HENT:  Head: Normocephalic and atraumatic.  Mouth/Throat: Oropharynx is clear and moist.  Eyes: EOM are normal. Pupils are equal, round, and reactive to light.  Neck: Normal range of motion. Neck supple.  Cardiovascular: Normal rate and regular rhythm.  Exam reveals no gallop and no friction rub.   No murmur heard. Pulmonary/Chest: Effort normal and breath sounds normal. No respiratory distress. He has no wheezes. He has no rales.  Abdominal: Soft. Bowel sounds are normal. He exhibits no distension and no mass. There is no tenderness. There  is no rebound and no guarding.  Musculoskeletal: Normal range of motion. He exhibits no edema or tenderness.  No lower extremity swelling or pain.  Neurological: He is alert and oriented to person, place, and time.  5/5 motor in all extremities. Sensation is fully intact.  Skin: Skin is warm and dry. No rash noted. No erythema.  Psychiatric: He has a normal mood and affect. His behavior is normal.  Nursing note and vitals reviewed.   ED Course  Procedures (including critical care time) Labs Review Labs Reviewed  BASIC METABOLIC PANEL - Abnormal; Notable for the following:    Glucose, Bld 109 (*)    All other components within normal limits  CBC - Abnormal; Notable for the following:    Platelets 136 (*)    All other components within normal limits  URINALYSIS, ROUTINE W REFLEX MICROSCOPIC (NOT AT [facility]) - Abnormal; Notable for the following:    APPearance CLOUDY (*)    Hgb urine dipstick MODERATE (*)    Ketones, ur 15 (*)    All other components within normal limits  URINE MICROSCOPIC-ADD ON - Abnormal; Notable for the following:    Squamous Epithelial / LPF 0-5 (*)    Bacteria, UA RARE (*)    All other components within normal limits  I-STAT TROPOININ, ED    Imaging Review Dg Chest 2 View  08/13/2015  CLINICAL DATA:  Hypotension for 1 day EXAM: CHEST  2 VIEW COMPARISON:  08/12/2015 FINDINGS: Normal mediastinum and cardiac silhouette. Normal pulmonary vasculature. No evidence of effusion, infiltrate, or pneumothorax. No acute bony abnormality. IMPRESSION: No acute cardiopulmonary process. Electronically Signed   By: Suzy Bouchard M.D.   On: 08/13/2015 17:12   Dg Chest 2 View  08/12/2015  CLINICAL DATA:  Acute onset of substernal chest pain and nausea. Initial encounter. EXAM: CHEST  2 VIEW COMPARISON:  Chest radiograph from 05/24/2005 FINDINGS: The lungs are well-aerated and clear. There is no evidence of focal opacification, pleural effusion or pneumothorax. The heart is normal  in size; the mediastinal contour is within normal limits. No acute osseous abnormalities are seen. IMPRESSION: No acute cardiopulmonary process seen. Electronically Signed   By: Garald Balding M.D.   On: 08/12/2015 03:57   I have personally reviewed and evaluated these images and lab results as part of my medical decision-making.   EKG Interpretation   Date/Time:  Wednesday August 13 2015 20:53:42 EST Ventricular Rate:  78 PR Interval:  145 QRS Duration: 101 QT Interval:  376 QTC Calculation: 428 R Axis:   85 Text Interpretation:  Sinus rhythm Atrial premature complex Borderline  right axis deviation Confirmed by Lincon Sahlin  MD, Harvy Riera ([id number]) on 08/13/2015  9:25:59 PM      MDM   Final diagnoses:  PAF (paroxysmal atrial fibrillation) (HCC)   A shows a normal sinus rhythm. Well-appearing. Given IV fluids. Discussed with Dr. Tresa Moore. Agrees with plan to discharge home to follow-up with  Dr. [name] as an outpatient.     Julianne Rice, MD 08/13/15 2235

## 2015-08-13 NOTE — Discharge Instructions (Signed)

## 2015-08-13 NOTE — ED Notes (Signed)
Pt here for afib and low BP. Sent by his doctor. Pt has no complaints.

## 2015-08-18 ENCOUNTER — Ambulatory Visit (INDEPENDENT_AMBULATORY_CARE_PROVIDER_SITE_OTHER): Payer: Medicare Other | Admitting: Cardiology

## 2015-08-18 ENCOUNTER — Encounter: Payer: Self-pay | Admitting: Cardiology

## 2015-08-18 VITALS — BP 118/80 | HR 76 | Ht 76.0 in | Wt 211.0 lb

## 2015-08-18 DIAGNOSIS — E7800 Pure hypercholesterolemia, unspecified: Secondary | ICD-10-CM | POA: Diagnosis not present

## 2015-08-18 DIAGNOSIS — R55 Syncope and collapse: Secondary | ICD-10-CM | POA: Insufficient documentation

## 2015-08-18 DIAGNOSIS — I480 Paroxysmal atrial fibrillation: Secondary | ICD-10-CM

## 2015-08-18 MED ORDER — DILTIAZEM HCL ER COATED BEADS 180 MG PO CP24: 180.0000 mg | ORAL_CAPSULE | Freq: Every day | ORAL | Status: DC

## 2015-08-18 MED ORDER — PRAVASTATIN SODIUM 40 MG PO TABS: 40.0000 mg | ORAL_TABLET | Freq: Every evening | ORAL | Status: DC

## 2015-08-18 NOTE — Progress Notes (Signed)
[name] [address], [address] [place], [place]  [address] Phone: [phone] Fax:  [phone]  Date:  08/18/2015   ID:  Alfred Field Ceth Mullady., DOB [birth year], MRN [id number]  PCP:  Mathews Argyle, MD   History of Present Illness: Alfred Salasar. is a 65 y.o. male with hyperlipidemia, paroxysmal atrial fibrillation, normal EF here for followup.   He had another episode of age or fibrillation which is noted by Dr. Felipa Eth on 08/13/15 that was rapid, tachycardia with accompanying hypotension. He was sent to the emergency department, transient lightheadedness, transient diaphoresis. He was not sure what was triggering his episodes of nausea, lightheadedness, retching, diaphoresis. Hit some loose stool. He had previous syncope with diagnosis of food poisoning. Troponin in the ER was normal. EKG in ER showed normal sinus rhythm.   His atrial fibrillation was very briefly detected on the monitor of approximately 3 minutes duration.   As above, he usually has brief episodes. Since increasing his medication back to 180 mg a day of diltiazem from 120 mg, he has not had any episodes. He did tell me that while working out at [employer] a stranger told him that he was breathing incorrectly during weight lifting. He thinks that changing his breathing patterns is also help with any fluttering sensation that may have occurred during exercise. It is very plausible that atrial stretch may have occurred during straining.     Wt Readings from Last 3 Encounters:  08/18/15 211 lb (95.709 kg)  08/12/15 210 lb (95.255 kg)  10/11/14 215 lb (97.523 kg)     Past Medical History  Diagnosis Date  . Hyperlipidemia   . Hyperglycemia   . Atrial fibrillation ([place])   . H/O echocardiogram   . Diverticulitis   . Palpitations   . Cancer [facility]     melanoma skin cancer shoulder    Past Surgical History  Procedure Laterality Date  . Colonoscopy      x2  . Polypectomy    . Wisdom tooth extraction     . Tonsillectomy      Current Outpatient Prescriptions  Medication Sig Dispense Refill  . aspirin EC 81 MG tablet Take 81 mg by mouth daily.    Marland Kitchen loratadine (CLARITIN) 10 MG tablet Take 10 mg by mouth daily.    . naproxen sodium (ANAPROX) 220 MG tablet Take 220 mg by mouth 2 (two) times daily as needed (for joint pain/stiffness.).     Marland Kitchen ondansetron (ZOFRAN ODT) 4 MG disintegrating tablet 4mg  ODT q4 hours prn nausea/vomit 20 tablet 0  . pravastatin (PRAVACHOL) 40 MG tablet Take 40 mg by mouth every evening.     . diltiazem (CARDIZEM CD) 180 MG 24 hr capsule TAKE 1 CAPSULE BY MOUTH ONCE DAILY 15 capsule 0   No current facility-administered medications for this visit.    Allergies:    Allergies  Allergen Reactions  . Crestor [Rosuvastatin] Other (See Comments)    Abn. Labs   . Lipitor [Atorvastatin] Other (See Comments)    Severe muscle aches     Social History:  The patient  reports that he has never smoked. He has never used smokeless tobacco. He reports that he drinks alcohol. He reports that he does not use illicit drugs.   Family History  Problem Relation Age of Onset  . Coronary artery disease Mother   . Heart attack Mother   . Heart disease Mother   . Heart disease Father   .  Colon cancer Neg Hx   . Emphysema Father    Mother died 12-15-2022 December 01, 1989 CABG pacer.  Father died MI - smoker  ROS:  Please see the history of present illness.   No fevers, no chills, no orthopnea, no PND   All other systems reviewed and negative.   PHYSICAL EXAM: VS:  BP 118/80 mmHg  Pulse 76  Ht 6\' 4"  (1.93 m)  Wt 211 lb (95.709 kg)  BMI 25.69 kg/m2 Well nourished, well developed, in no acute distress HEENT: normal, [place]/AT, EOMI Neck: no JVD, normal carotid upstroke, no bruit Cardiac:  normal S1, S2; RRR; no murmur Lungs:  clear to auscultation bilaterally, no wheezing, rhonchi or rales Abd: soft, nontender, no hepatomegaly, no bruits Ext: no edema, 2+ distal pulses Skin: warm and dry GU:  deferred Neuro: no focal abnormalities noted, AAO x 3  EKG:  Sinus rhythm, 65, PVC, no other abnormalities.  LABS: 2022/12/15 -  LDL 143    Echocardiogram 08/23/2007-normal ejection fraction, upper left placed atrial size, thickened mitral valve with bilaterally for prolapse, no mitral regurgitation.  Negative nuclear stress test 06/2011  ASSESSMENT AND PLAN:  1. Paroxysmal atrial fibrillation- had an episode in Dr. Carlyle Lipa office 08/13/15. This was in the setting of a stomach virus. Currently feeling better. He was hypotensive, went to the emergency room, received fluids. Improved. He has had a couple of these episodes where nausea and vomiting have been accompanied with syncope or near syncope. Typical vagal-like reaction. He is not having any accompanied chest pain or exertional anginal symptoms. Prior nuclear stress test in 02-Dec-2010 was low risk.  Diltiazem working well.  CHADS-VAS - 1 -age. If at some point we are unable to utilize diltiazem because of hypotension, we could consider potentially use of antiarrhythmic such as Tikosyn. 2. Hyperlipidemia - pravastatin QOD.  3. Vagal near syncope-currently with nausea as above. Hydration. 4. He was very thankful for hospice, who took care of his 45 year old mother.  Signed, Candee Furbish, MD [facility]  08/18/2015 2:40 PM

## 2015-08-18 NOTE — Patient Instructions (Signed)
Medication Instructions:  The current medical regimen is effective;  continue present plan and medications.  Testing/Procedures: Your physician has requested that you have an echocardiogram. Echocardiography is a painless test that uses sound waves to create images of your heart. It provides your doctor with information about the size and shape of your heart and how well your heart's chambers and valves are working. This procedure takes approximately one hour. There are no restrictions for this procedure.  Follow-Up: Follow up in 6 months with Dr. Skains.  You will receive a letter in the mail 2 months before you are due.  Please call us when you receive this letter to schedule your follow up appointment.  If you need a refill on your cardiac medications before your next appointment, please call your pharmacy.  Thank you for choosing [facility]!!       

## 2015-08-22 ENCOUNTER — Ambulatory Visit (HOSPITAL_COMMUNITY): Payer: Medicare Other | Attending: Cardiovascular Disease

## 2015-08-22 ENCOUNTER — Telehealth: Payer: Self-pay | Admitting: *Deleted

## 2015-08-22 ENCOUNTER — Other Ambulatory Visit: Payer: Self-pay

## 2015-08-22 DIAGNOSIS — I4891 Unspecified atrial fibrillation: Secondary | ICD-10-CM | POA: Diagnosis present

## 2015-08-22 DIAGNOSIS — I480 Paroxysmal atrial fibrillation: Secondary | ICD-10-CM | POA: Insufficient documentation

## 2015-08-22 DIAGNOSIS — E785 Hyperlipidemia, unspecified: Secondary | ICD-10-CM | POA: Diagnosis not present

## 2015-08-22 DIAGNOSIS — I340 Nonrheumatic mitral (valve) insufficiency: Secondary | ICD-10-CM | POA: Insufficient documentation

## 2015-08-22 DIAGNOSIS — I517 Cardiomegaly: Secondary | ICD-10-CM | POA: Insufficient documentation

## 2015-08-22 NOTE — Telephone Encounter (Signed)
Lmptcb for echo results

## 2016-03-02 ENCOUNTER — Encounter: Payer: Self-pay | Admitting: Cardiology

## 2016-03-22 ENCOUNTER — Encounter: Payer: Self-pay | Admitting: Cardiology

## 2016-03-22 ENCOUNTER — Ambulatory Visit (INDEPENDENT_AMBULATORY_CARE_PROVIDER_SITE_OTHER): Payer: Medicare Other | Admitting: Cardiology

## 2016-03-22 ENCOUNTER — Encounter (INDEPENDENT_AMBULATORY_CARE_PROVIDER_SITE_OTHER): Payer: Self-pay

## 2016-03-22 VITALS — BP 126/84 | HR 70 | Ht 76.0 in | Wt 215.4 lb

## 2016-03-22 DIAGNOSIS — I480 Paroxysmal atrial fibrillation: Secondary | ICD-10-CM

## 2016-03-22 DIAGNOSIS — R55 Syncope and collapse: Secondary | ICD-10-CM | POA: Diagnosis not present

## 2016-03-22 DIAGNOSIS — E7800 Pure hypercholesterolemia, unspecified: Secondary | ICD-10-CM

## 2016-03-22 NOTE — Progress Notes (Signed)
[facility]. [address], [place]  [address] Phone: [phone] Fax:  [phone]  Date:  03/22/2016   ID:  Alfred Field Constant Freyre., DOB 1950-08-30, MRN [id number]  PCP:  Mathews Argyle, MD   History of Present Illness: Alfred Thomas. is a 65 y.o. male with hyperlipidemia, paroxysmal atrial fibrillation, normal EF here for followup.   Has palpitations occasionally. Occasionally will have that sinking feeling, vagal-like episode. See below for details. Encouraged hydration, salt intake. He is only on diltiazem which can lower his blood pressure but he feels much better from a palpitations aspect with the diltiazem.  He had another episode of age or fibrillation which is noted by Dr. Felipa Eth on 08/13/15 that was rapid, tachycardia with accompanying hypotension. He was sent to the emergency department, transient lightheadedness, transient diaphoresis. He was not sure what was triggering his episodes of nausea, lightheadedness, retching, diaphoresis. Hit some loose stool. He had previous syncope with diagnosis of food poisoning. Troponin in the ER was normal. EKG in ER showed normal sinus rhythm.   His atrial fibrillation was very briefly detected on the monitor of approximately 3 minutes duration.   As above, he usually has brief episodes. Since increasing his medication back to 180 mg a day of diltiazem from 120 mg, he has not had any episodes. He did tell me that while working out at [employer] a stranger told him that he was breathing incorrectly during weight lifting. He thinks that changing his breathing patterns is also help with any fluttering sensation that may have occurred during exercise. It is very plausible that atrial stretch may have occurred during straining.     Wt Readings from Last 3 Encounters:  03/22/16 215 lb 6.4 oz (97.7 kg)  08/18/15 211 lb (95.7 kg)  08/12/15 210 lb (95.3 kg)     Past Medical History:  Diagnosis Date  . Atrial  fibrillation ([place])   . Cancer ([place])    melanoma skin cancer shoulder  . Diverticulitis   . H/O echocardiogram   . Hyperglycemia   . Hyperlipidemia   . Palpitations     Past Surgical History:  Procedure Laterality Date  . COLONOSCOPY     x2  . POLYPECTOMY    . TONSILLECTOMY    . WISDOM TOOTH EXTRACTION      Current Outpatient Prescriptions  Medication Sig Dispense Refill  . aspirin EC 81 MG tablet Take 81 mg by mouth daily.    Marland Kitchen diltiazem (CARDIZEM CD) 180 MG 24 hr capsule Take 1 capsule (180 mg total) by mouth daily. 30 capsule 11  . loratadine (CLARITIN) 10 MG tablet Take 10 mg by mouth daily.    . naproxen sodium (ANAPROX) 220 MG tablet Take 220 mg by mouth 2 (two) times daily as needed (for joint pain/stiffness.).     Marland Kitchen ondansetron (ZOFRAN) 4 MG tablet Take 4 mg by mouth every 8 (eight) hours as needed for nausea or vomiting.    . pravastatin (PRAVACHOL) 40 MG tablet Take 40 mg by mouth every other day.     No current facility-administered medications for this visit.     Allergies:    Allergies  Allergen Reactions  . Crestor [Rosuvastatin] Other (See Comments)    Abn. Labs   . Lipitor [Atorvastatin] Other (See Comments)    Severe muscle aches     Social History:  The patient  reports that he has never smoked. He has never used smokeless  tobacco. He reports that he drinks alcohol. He reports that he does not use drugs.   Family History  Problem Relation Age of Onset  . Coronary artery disease Mother   . Heart attack Mother   . Heart disease Mother   . Heart disease Father   . Emphysema Father   . Colon cancer Neg Hx    Mother died December 06, 2022 11/21/89 CABG pacer.  Father died MI - smoker  ROS:  Please see the history of present illness.   No fevers, no chills, no orthopnea, no PND   All other systems reviewed and negative.   PHYSICAL EXAM: VS:  BP 126/84   Pulse 70   Ht 6\' 4"  (1.93 m)   Wt 215 lb 6.4 oz (97.7 kg)   BMI 26.22 kg/m  Well nourished, well developed,  in no acute distress  HEENT: normal, [place]/AT, EOMI Neck: no JVD, normal carotid upstroke, no bruit Cardiac:  normal S1, S2; RRR; no murmur  Lungs:  clear to auscultation bilaterally, no wheezing, rhonchi or rales  Abd: soft, nontender, no hepatomegaly, no bruits  Ext: no edema, 2+ distal pulses Skin: warm and dry  GU: deferred Neuro: no focal abnormalities noted, AAO x 3  EKG:  Sinus rhythm, 65, PVC, no other abnormalities.  LABS: Dec 06, 2022 -  LDL 143    Echocardiogram 08/23/2007-normal ejection fraction, upper left atrial size, thickened mitral valve with bilaterally for prolapse, no mitral regurgitation.  ECHO 08/22/15 -   -Left ventricle: The cavity size was normal. There was mild   concentric hypertrophy. Systolic function was vigorous. The   estimated ejection fraction was in the range of 65% to 70%. Wall   motion was normal; there were no regional wall motion   abnormalities. The study is not technically sufficient to allow   evaluation of LV diastolic function. - Mitral valve: There was trivial regurgitation. - Left atrium: The atrium was mildly dilated. - Right ventricle: The cavity size was normal. Wall thickness was   normal. Systolic function was normal. - Inferior vena cava: The vessel was normal in size. The   respirophasic diameter changes were in the normal range (>= 50%),   consistent with normal central venous pressure.  Negative nuclear stress test 06/2011  ASSESSMENT AND PLAN:  1. Paroxysmal atrial fibrillation- ECHO November 22, 2015 reassuring. Mild LA dil. Had an episode in Dr. Carlyle Lipa office 08/13/15. This was in the setting of a stomach virus. Currently feeling better. He was hypotensive, went to the emergency room, received fluids. Improved. He has had a couple of these episodes where nausea and vomiting have been accompanied with syncope or near syncope. Typical vagal-like reaction. He is not having any accompanied chest pain or exertional anginal symptoms. Prior nuclear  stress test in 22-Nov-2010 was low risk.  Diltiazem working well.  CHADS-VAS - 1 -age. If at some point we are unable to utilize diltiazem because of hypotension, we could consider potentially use of antiarrhythmic such as Tikosyn. 2. Hyperlipidemia - pravastatin QOD.  3. Vagal near syncope-currently with nausea as above. Hydration. Several hours in ER. Hydration, salt. Lay down if he started to feel prodrome. Legs above head. 4. He was very thankful for hospice, who took care of his 35 year old mother. 5. One year follow up. He knows to call us sooner if he is having any difficulties.  Signed, Candee Furbish, MD [facility]  03/22/2016 8:41 AM

## 2016-03-22 NOTE — Patient Instructions (Signed)

## 2016-08-26 ENCOUNTER — Other Ambulatory Visit: Payer: Self-pay | Admitting: Cardiology

## 2016-09-29 ENCOUNTER — Other Ambulatory Visit: Payer: Self-pay | Admitting: Cardiology

## 2017-06-02 ENCOUNTER — Other Ambulatory Visit: Payer: Self-pay | Admitting: Cardiology

## 2017-09-29 ENCOUNTER — Encounter: Payer: Self-pay | Admitting: Cardiology

## 2017-10-07 ENCOUNTER — Ambulatory Visit: Payer: Medicare Other | Admitting: Cardiology

## 2017-10-07 ENCOUNTER — Encounter: Payer: Self-pay | Admitting: Cardiology

## 2017-10-07 VITALS — BP 104/78 | HR 63 | Ht 76.0 in | Wt 218.1 lb

## 2017-10-07 DIAGNOSIS — E7800 Pure hypercholesterolemia, unspecified: Secondary | ICD-10-CM

## 2017-10-07 DIAGNOSIS — I480 Paroxysmal atrial fibrillation: Secondary | ICD-10-CM | POA: Diagnosis not present

## 2017-10-07 DIAGNOSIS — R0989 Other specified symptoms and signs involving the circulatory and respiratory systems: Secondary | ICD-10-CM

## 2017-10-07 NOTE — Patient Instructions (Addendum)
Medication Instructions:  Your physician recommends that you continue on your current medications as directed. Please refer to the Current Medication list given to you today.   Labwork: None ordered  Testing/Procedures: Your physician has requested that you have a carotid duplex. This test is an ultrasound of the carotid arteries in your neck. It looks at blood flow through these arteries that supply the brain with blood. Allow one hour for this exam. There are no restrictions or special instructions.   Follow-Up: Your physician wants you to follow-up in: 1 year with Dr. [name]. You will receive a reminder letter in the mail two months in advance. If you don't receive a letter, please call our office to schedule the follow-up appointment.   Any Other Special Instructions Will Be Listed Below (If Applicable).     If you need a refill on your cardiac medications before your next appointment, please call your pharmacy.

## 2017-10-07 NOTE — Progress Notes (Addendum)
[facility]. [address], [address] [place], [place]  [address] Phone: [phone] Fax:  [phone]  Date:  10/07/2017   ID:  Alfred Thomas., DOB [birth year], MRN [id number]  PCP:  Alfred Manes, MD   History of Present Illness: Alfred Thomas. is a 67 y.o. male with hyperlipidemia, paroxysmal atrial fibrillation, normal EF here for followup.   Has palpitations occasionally. Occasionally will have that sinking feeling, vagal-like episode. See below for details. Encouraged hydration, salt intake. He is only on diltiazem which can lower his blood pressure but he feels much better from a palpitations aspect with the diltiazem.  He had another episode of a fibrillation which is noted by Alfred Thomas on 08/13/15 that was rapid, tachycardia with accompanying hypotension. He was sent to the emergency department, transient lightheadedness, transient diaphoresis. He was not sure what was triggering his episodes of nausea, lightheadedness, retching, diaphoresis. Hit some loose stool. He had previous syncope with diagnosis of food poisoning. Troponin in the ER was normal. EKG in ER showed normal sinus rhythm.   His atrial fibrillation was very briefly detected on the monitor of approximately 3 minutes duration.   As above, he usually has brief episodes. Since increasing his medication back to 180 mg a day of diltiazem from 120 mg, he has not had any episodes. He did tell me that while working out at [employer] a stranger told him that he was breathing incorrectly during weight lifting. He thinks that changing his breathing patterns is also help with any fluttering sensation that may have occurred during exercise. It is very plausible that atrial stretch may have occurred during straining.  10/07/17-overall is been doing quite well.  No syncope, no chest pain.  He did ask about carotid vascular disease.  He has been having some increased frequency of urination.  He wondered if this was a side effect  of diltiazem.  I reassured him.  He will discuss this further with Alfred Thomas.  Could be BPH.  No fevers chills nausea vomiting syncope bleeding   Wt Readings from Last 3 Encounters:  10/07/17 218 lb 1.9 oz (98.9 kg)  03/22/16 215 lb 6.4 oz (97.7 kg)  08/18/15 211 lb (95.7 kg)     Past Medical History:  Diagnosis Date  . Atrial fibrillation ([place])   . Cancer ([place])    melanoma skin cancer shoulder  . Diverticulitis   . H/O echocardiogram   . Hyperglycemia   . Hyperlipidemia   . Palpitations     Past Surgical History:  Procedure Laterality Date  . COLONOSCOPY     x2  . POLYPECTOMY    . TONSILLECTOMY    . WISDOM TOOTH EXTRACTION      Current Outpatient Medications  Medication Sig Dispense Refill  . aspirin EC 81 MG tablet Take 81 mg by mouth daily.    . Coenzyme Q10 (CO Q 10 PO) Take by mouth daily.    Marland Kitchen diltiazem (CARDIZEM CD) 180 MG 24 hr capsule Take 1 capsule (180 mg total) by mouth daily. Please make an appt with Dr. [name] before anymore refills. Thanks 1st attempt 30 capsule 0  . loratadine (CLARITIN) 10 MG tablet Take 10 mg by mouth daily.    . naproxen sodium (ANAPROX) 220 MG tablet Take 220 mg by mouth 2 (two) times daily as needed (for joint pain/stiffness.).     Marland Kitchen ondansetron (ZOFRAN) 4 MG tablet Take 4 mg by mouth every 8 (eight) hours  as needed for nausea or vomiting.    . pravastatin (PRAVACHOL) 40 MG tablet Take 40 mg by mouth every other day.     No current facility-administered medications for this visit.     Allergies:    Allergies  Allergen Reactions  . Crestor [Rosuvastatin] Other (See Comments)    Abn. Labs   . Lipitor [Atorvastatin] Other (See Comments)    Severe muscle aches     Social History:  The patient  reports that  has never smoked. he has never used smokeless tobacco. He reports that he drinks alcohol. He reports that he does not use drugs.   Family History  Problem Relation Age of Onset  . Coronary artery disease Mother   .  Heart attack Mother   . Heart disease Mother   . Heart disease Father   . Emphysema Father   . Colon cancer Neg Hx    Mother died Dec 22, 2022 Dec 04, 1989 CABG pacer.  Father died MI - smoker  ROS:  Please see the history of present illness.   No fevers, no chills, no orthopnea, no PND   All other systems reviewed and negative.   PHYSICAL EXAM: VS:  BP 104/78   Pulse 63   Ht 6\' 4"  (1.93 m)   Wt 218 lb 1.9 oz (98.9 kg)   SpO2 95%   BMI 26.55 kg/m  GEN: Well nourished, well developed, in no acute distress  HEENT: normal  Neck: no JVD, soft left carotid bruit, no or masses Cardiac: RRR; no murmurs, rubs, or gallops,no edema  Respiratory:  clear to auscultation bilaterally, normal work of breathing GI: soft, nontender, nondistended, + BS MS: no deformity or atrophy  Skin: warm and dry, no rash Neuro:  Alert and Oriented x 3, Strength and sensation are intact Psych: euthymic mood, full affect   EKG: 10/07/17-sinus rhythm 63 with no other abnormalities.  Personally viewed-prior sinus rhythm, 65, PVC, no other abnormalities.  LABS: 2022-12-22 -  LDL 143    Echocardiogram 08/23/2007-normal ejection fraction, upper left atrial size, thickened mitral valve with bilaterally for prolapse, no mitral regurgitation.  ECHO 08/22/15 -   -Left ventricle: The cavity size was normal. There was mild   concentric hypertrophy. Systolic function was vigorous. The   estimated ejection fraction was in the range of 65% to 70%. Wall   motion was normal; there were no regional wall motion   abnormalities. The study is not technically sufficient to allow   evaluation of LV diastolic function. - Mitral valve: There was trivial regurgitation. - Left atrium: The atrium was mildly dilated. - Right ventricle: The cavity size was normal. Wall thickness was   normal. Systolic function was normal. - Inferior vena cava: The vessel was normal in size. The   respirophasic diameter changes were in the normal range (>= 50%),    consistent with normal central venous pressure.  Negative nuclear stress test 06/2011  02/03/17 --LDL 160, HDL 33, total cholesterol 205, hemoglobin A1c 6.3, creatinine 1.0.  ASSESSMENT AND PLAN:  1. Paroxysmal atrial fibrillation- ECHO December 05, 2015 reassuring. Mild LA dil. Had an episode in Dr. Carlyle Lipa office 08/13/15. This was in the setting of a stomach virus. Prior nuclear stress test in 12/05/10 was low risk.  Diltiazem working well.  CHADS-VAS - 1 -age. If at some point we are unable to utilize diltiazem because of hypotension, we could consider potentially use of antiarrhythmic such as Tikosyn.  No further dizzy episodes.  Blood pressure excellent at 104/78.  No palpitations. 2. Hyperlipidemia - pravastatin QOD.  My recommendation would be to increase his pravastatin use.  LDL 160. 3. Vagal near syncope- no further complaints.   4. Carotid bruit-will check carotid ultrasound.   5. One year follow up. He knows to call us sooner if he is having any difficulties.  Signed, Candee Furbish, MD [facility]  10/07/2017 8:33 AM

## 2017-10-12 ENCOUNTER — Ambulatory Visit (HOSPITAL_COMMUNITY)
Admission: RE | Admit: 2017-10-12 | Discharge: 2017-10-12 | Disposition: A | Discharge status: Home / Self Care | Payer: Medicare Other | Source: Ambulatory Visit | Attending: Cardiovascular Disease | Admitting: Cardiovascular Disease

## 2017-10-12 DIAGNOSIS — R0989 Other specified symptoms and signs involving the circulatory and respiratory systems: Secondary | ICD-10-CM | POA: Insufficient documentation

## 2017-10-20 ENCOUNTER — Encounter: Payer: Self-pay | Admitting: *Deleted

## 2017-11-05 ENCOUNTER — Other Ambulatory Visit: Payer: Self-pay | Admitting: Cardiology

## 2018-11-06 ENCOUNTER — Other Ambulatory Visit: Payer: Self-pay | Admitting: Cardiology

## 2019-05-24 ENCOUNTER — Other Ambulatory Visit: Payer: Self-pay | Admitting: Cardiology

## 2019-06-04 ENCOUNTER — Other Ambulatory Visit: Payer: Self-pay

## 2019-06-04 DIAGNOSIS — Z20822 Contact with and (suspected) exposure to covid-19: Secondary | ICD-10-CM

## 2019-06-06 LAB — NOVEL CORONAVIRUS, NAA: SARS-CoV-2, NAA: NOT DETECTED

## 2019-06-12 ENCOUNTER — Other Ambulatory Visit: Payer: Self-pay

## 2019-06-12 ENCOUNTER — Encounter: Payer: Self-pay | Admitting: Cardiology

## 2019-06-12 ENCOUNTER — Ambulatory Visit: Payer: Medicare Other | Admitting: Cardiology

## 2019-06-12 VITALS — BP 110/70 | HR 61 | Ht 76.0 in | Wt 206.4 lb

## 2019-06-12 DIAGNOSIS — E7800 Pure hypercholesterolemia, unspecified: Secondary | ICD-10-CM | POA: Diagnosis not present

## 2019-06-12 DIAGNOSIS — I480 Paroxysmal atrial fibrillation: Secondary | ICD-10-CM

## 2019-06-12 NOTE — Patient Instructions (Signed)
Medication Instructions:  The current medical regimen is effective;  continue present plan and medications.  *If you need a refill on your cardiac medications before your next appointment, please call your pharmacy*   You have been referred to the [facility] office.  Please schedule an appointment to see Dr Mali Hilty.  Follow-Up: At [facility], you and your health needs are our priority.  As part of our continuing mission to provide you with exceptional heart care, we have created designated Provider Care Teams.  These Care Teams include your primary Cardiologist (physician) and Advanced Practice Providers (APPs -  Physician Assistants and Nurse Practitioners) who all work together to provide you with the care you need, when you need it.  Your next appointment:   12 months  The format for your next appointment:   In Person  Provider:   You may see Candee Furbish, MD or one of the following Advanced Practice Providers on your designated Care Team:    Truitt Merle, NP  Cecilie Kicks, NP  Kathyrn Drown, NP   Thank you for choosing [facility]!!

## 2019-06-12 NOTE — Progress Notes (Signed)
[facility]. [address], [address] [place], [place]  [address] Phone: [phone] Fax:  [phone]  Date:  07-06-2019   ID:  Alfred Field Bronson Vodicka., DOB [birth year], MRN [id number]  PCP:  Lajean Manes, MD   History of Present Illness: Alfred Thomas. is a 68 y.o. male with hyperlipidemia, paroxysmal atrial fibrillation, normal EF here for followup.   Has palpitations occasionally. Occasionally will have that sinking feeling, vagal-like episode. See below for details. Encouraged hydration, salt intake. He is only on diltiazem which can lower his blood pressure but he feels much better from a palpitations aspect with the diltiazem.  He had another episode of a fibrillation which is noted by Dr. Felipa Eth on 08/13/15 that was rapid, tachycardia with accompanying hypotension. He was sent to the emergency department, transient lightheadedness, transient diaphoresis. He was not sure what was triggering his episodes of nausea, lightheadedness, retching, diaphoresis. Hit some loose stool. He had previous syncope with diagnosis of food poisoning. Troponin in the ER was normal. EKG in ER showed normal sinus rhythm.   His atrial fibrillation was very briefly detected on the monitor of approximately 3 minutes duration.   As above, he usually has brief episodes. Since increasing his medication back to 180 mg a day of diltiazem from 120 mg, he has not had any episodes. He did tell me that while working out at [employer] a stranger told him that he was breathing incorrectly during weight lifting. He thinks that changing his breathing patterns is also help with any fluttering sensation that may have occurred during exercise. It is very plausible that atrial stretch may have occurred during straining.  10/07/17-overall is been doing quite well.  No syncope, no chest pain.  He did ask about carotid vascular disease.  He has been having some increased frequency of urination.  He wondered if this was a side effect  of diltiazem.  I reassured him.  He will discuss this further with Dr. Felipa Eth.  Could be BPH.  No fevers chills nausea vomiting syncope bleeding  07-06-19 - Dad died at 6. He just turned 53. Not exercising like used to but he is still walking. Liked going to [employer].  Not doing this during Covid.  We discussed lipid therapy today.  No further atrial fibrillation episodes.  Diltiazem working well.  No real dizziness when getting up.  No fevers chills nausea vomiting syncope bleeding.   Wt Readings from Last 3 Encounters:  07-06-2019 206 lb 6.4 oz (93.6 kg)  10/07/17 218 lb 1.9 oz (98.9 kg)  03/22/16 215 lb 6.4 oz (97.7 kg)     Past Medical History:  Diagnosis Date   Atrial fibrillation ([place])    Cancer ([place])    melanoma skin cancer shoulder   Diverticulitis    H/O echocardiogram    Hyperglycemia    Hyperlipidemia    Palpitations     Past Surgical History:  Procedure Laterality Date   COLONOSCOPY     x2   POLYPECTOMY     TONSILLECTOMY     WISDOM TOOTH EXTRACTION      Current Outpatient Medications  Medication Sig Dispense Refill   aspirin EC 81 MG tablet Take 81 mg by mouth daily.     Coenzyme Q10 (CO Q 10 PO) Take by mouth daily.     diltiazem (CARDIZEM CD) 180 MG 24 hr capsule Take 1 capsule (180 mg total) by mouth daily. Please make overdue appt with Dr. [name] before  anymore refills. 2nd attempt 15 capsule 0   loratadine (CLARITIN) 10 MG tablet Take 10 mg by mouth daily.     naproxen sodium (ANAPROX) 220 MG tablet Take 220 mg by mouth 2 (two) times daily as needed (for joint pain/stiffness.).      ondansetron (ZOFRAN) 4 MG tablet Take 4 mg by mouth every 8 (eight) hours as needed for nausea or vomiting.     pravastatin (PRAVACHOL) 40 MG tablet Take 40 mg by mouth every other day.     No current facility-administered medications for this visit.     Allergies:    Allergies  Allergen Reactions   Crestor [Rosuvastatin] Other (See Comments)    Abn. Labs      Lipitor [Atorvastatin] Other (See Comments)    Severe muscle aches     Social History:  The patient  reports that he has never smoked. He has never used smokeless tobacco. He reports current alcohol use. He reports that he does not use drugs.   Family History  Problem Relation Age of Onset   Coronary artery disease Mother    Heart attack Mother    Heart disease Mother    Heart disease Father    Emphysema Father    Colon cancer Neg Hx    Mother died November 28, 2022 11-07-89 CABG pacer.  Father died MI - smoker  ROS:  Please see the history of present illness.   No fevers, no chills, no orthopnea, no PND   All other systems reviewed and negative.   PHYSICAL EXAM: VS:  BP 110/70    Pulse 61    Ht 6\' 4"  (1.93 m)    Wt 206 lb 6.4 oz (93.6 kg)    SpO2 98%    BMI 25.12 kg/m  GEN: Well nourished, well developed, in no acute distress  HEENT: normal  Neck: no JVD, prior soft left carotid bruit, no masses Cardiac: RRR; no murmurs, rubs, or gallops,no edema  Respiratory:  clear to auscultation bilaterally, normal work of breathing GI: soft, nontender, nondistended, + BS MS: no deformity or atrophy  Skin: warm and dry, no rash Neuro:  Alert and Oriented x 3, Strength and sensation are intact Psych: euthymic mood, full affect    EKG: 06/12/2019-sinus rhythm 61 no other abnormalities.  10/07/17-sinus rhythm 63 with no other abnormalities.  Personally viewed-prior sinus rhythm, 65, PVC, no other abnormalities.  LABS: November 28, 2022 -  LDL 143    Echocardiogram 08/23/2007-normal ejection fraction, upper left atrial size, thickened mitral valve with bilaterally for prolapse, no mitral regurgitation.  ECHO 08/22/15 -   -Left ventricle: The cavity size was normal. There was mild   concentric hypertrophy. Systolic function was vigorous. The   estimated ejection fraction was in the range of 65% to 70%. Wall   motion was normal; there were no regional wall motion   abnormalities. The study is not technically  sufficient to allow   evaluation of LV diastolic function. - Mitral valve: There was trivial regurgitation. - Left atrium: The atrium was mildly dilated. - Right ventricle: The cavity size was normal. Wall thickness was   normal. Systolic function was normal. - Inferior vena cava: The vessel was normal in size. The   respirophasic diameter changes were in the normal range (>= 50%),   consistent with normal central venous pressure.  Negative nuclear stress test 06/2011  02/03/17 --LDL 160, HDL 33, total cholesterol 205, hemoglobin A1c 6.3, creatinine 1.0.  ASSESSMENT AND PLAN:  1. Paroxysmal atrial fibrillation-  ECHO 2017 reassuring. Mild LA dil. Had an episode in Dr. Carlyle Lipa office 08/13/15. This was in the setting of a stomach virus. Prior nuclear stress test in 2012 was low risk.  Diltiazem working well.  He states that this medication has really helped him.  CHADS-VAS - 1 -age. If at some point we are unable to utilize diltiazem because of hypotension, we could consider potentially use of antiarrhythmic such as Tikosyn.  No further dizzy episodes.   2. Hyperlipidemia - pravastatin QOD.  Prior LDL 160. Atorvastatin bothered him, LFT's, elevated, ached. Crestor had some issues as well.  Dr. Felipa Eth try these.  He is doing well on the pravastatin however I would like for him to have more aggressive lipid-lowering therapy.  I will refer him to Dr. Debara Pickett for lipid discussion and further treatment strategies.  He does have mild carotid plaque. 3. Vagal near syncope- no further complaints.  Seems to doing quite well. 4. Carotid artery plaque - mild bilaterally 2019.    5. 1 year follow-up  Signed, Candee Furbish, MD [facility]  06/12/2019 10:10 AM

## 2019-06-20 ENCOUNTER — Other Ambulatory Visit: Payer: Self-pay | Admitting: Cardiology

## 2019-08-22 ENCOUNTER — Ambulatory Visit: Payer: Medicare PPO | Admitting: Internal Medicine

## 2019-08-22 ENCOUNTER — Encounter: Payer: Self-pay | Admitting: Internal Medicine

## 2019-08-22 ENCOUNTER — Other Ambulatory Visit: Payer: Self-pay

## 2019-08-22 VITALS — BP 122/74 | HR 63 | Ht 76.0 in | Wt 211.6 lb

## 2019-08-22 DIAGNOSIS — I480 Paroxysmal atrial fibrillation: Secondary | ICD-10-CM | POA: Diagnosis not present

## 2019-08-22 DIAGNOSIS — I6523 Occlusion and stenosis of bilateral carotid arteries: Secondary | ICD-10-CM | POA: Diagnosis not present

## 2019-08-22 DIAGNOSIS — E785 Hyperlipidemia, unspecified: Secondary | ICD-10-CM

## 2019-08-22 MED ORDER — EZETIMIBE 10 MG PO TABS: 10.0000 mg | ORAL_TABLET | Freq: Every day | ORAL | 3 refills | Status: DC

## 2019-08-22 NOTE — Progress Notes (Signed)
LIPID CLINIC CONSULT NOTE  Chief Complaint:  Manage dyslipidemia  Primary Care Physician: Lajean Manes, MD  Primary Cardiologist:  Candee Furbish, MD  HPI:  Alfred Thomas. is a 69 y.o. male who is being seen today for the evaluation of dyslipidemia at the request of Jerline Pain, MD.  This is a pleasant 69 year old male with few cardiac risk factors however did have a history of paroxysmal atrial fibrillation with a low CHA2DS2-VASc score on aspirin and diltiazem.  He does not appear to have hypertension or diabetes.  He has dyslipidemia and had elevated cholesterol.  In the past he was tried on rosuvastatin which caused some liver enzyme abnormalities.  He was then switched to atorvastatin which caused muscle aches.  Subsequently he has been on pravastatin 40 mg daily which she is tolerating well however his lipids are still elevated.  Most recently in March 2020 his total cholesterol was 212, triglycerides 173, HDL 31 and LDL 147.  He was noted to have some mild carotid atherosclerosis based on vascular imaging.  No known coronary disease, prior stroke or severe early onset heart disease in the family although both parents had coronary disease.  PMHx:  Past Medical History:  Diagnosis Date  . Atrial fibrillation ([place])   . Cancer ([place])    melanoma skin cancer shoulder  . Diverticulitis   . H/O echocardiogram   . Hyperglycemia   . Hyperlipidemia   . Palpitations     Past Surgical History:  Procedure Laterality Date  . COLONOSCOPY     x2  . POLYPECTOMY    . TONSILLECTOMY    . WISDOM TOOTH EXTRACTION      FAMHx:  Family History  Problem Relation Age of Onset  . Coronary artery disease Mother   . Heart attack Mother   . Heart disease Mother   . Heart disease Father   . Emphysema Father   . Colon cancer Neg Hx     SOCHx:   reports that he has never smoked. He has never used smokeless tobacco. He reports current alcohol use. He reports that he does not use  drugs.  ALLERGIES:  Allergies  Allergen Reactions  . Crestor [Rosuvastatin] Other (See Comments)    Abn. Labs   . Lipitor [Atorvastatin] Other (See Comments)    Severe muscle aches     ROS: A comprehensive review of systems was negative.  HOME MEDS: Current Outpatient Medications on File Prior to Visit  Medication Sig Dispense Refill  . aspirin EC 81 MG tablet Take 81 mg by mouth daily.    . Coenzyme Q10 (CO Q 10 PO) Take by mouth daily.    Marland Kitchen diltiazem (CARDIZEM CD) 180 MG 24 hr capsule Take 1 capsule (180 mg total) by mouth daily. 90 capsule 3  . loratadine (CLARITIN) 10 MG tablet Take 10 mg by mouth daily.    . naproxen sodium (ANAPROX) 220 MG tablet Take 220 mg by mouth 2 (two) times daily as needed (for joint pain/stiffness.).     Marland Kitchen ondansetron (ZOFRAN) 4 MG tablet Take 4 mg by mouth every 8 (eight) hours as needed for nausea or vomiting.    . pravastatin (PRAVACHOL) 40 MG tablet Take 40 mg by mouth every other day.     No current facility-administered medications on file prior to visit.    LABS/IMAGING: No results found for this or any previous visit (from the past 48 hour(s)). No results found.  LIPID PANEL: No results found  for: CHOL, TRIG, HDL, CHOLHDL, VLDL, LDLCALC, LDLDIRECT  WEIGHTS: Wt Readings from Last 3 Encounters:  08/22/19 211 lb 9.6 oz (96 kg)  06/12/19 206 lb 6.4 oz (93.6 kg)  10/07/17 218 lb 1.9 oz (98.9 kg)    VITALS: BP 122/74   Pulse 63   Ht 6\' 4"  (1.93 m)   Wt 211 lb 9.6 oz (96 kg)   SpO2 97%   BMI 25.76 kg/m   EXAM: General appearance: alert and no distress Neck: no carotid bruit, no JVD and thyroid not enlarged, symmetric, no tenderness/mass/nodules Lungs: clear to auscultation bilaterally Heart: regular rate and rhythm, S1, S2 normal, no murmur, click, rub or gallop Abdomen: soft, non-tender; bowel sounds normal; no masses,  no organomegaly Extremities: extremities normal, atraumatic, no cyanosis or edema Pulses: 2+ and  symmetric Skin: Skin color, texture, turgor normal. No rashes or lesions Neurologic: Grossly normal Psych: Pleasant  EKG: Deferred  ASSESSMENT: 1. Mixed dyslipidemia, goal LDL less than 100 2. Mild bilateral carotid disease 3. Family history of coronary disease 4. Relative statin intolerance-myalgias  PLAN: 1.   Mr. Gaye had relative statin intolerance to atorvastatin and rosuvastatin but is tolerating moderate potency pravastatin.  His LDL remains above target and I think it be improved with a combination of increased exercise, some dietary changes and the addition of ezetimibe.  We will try to target his LDL less than 100.  We will plan repeat liver enzymes given a history of elevations in the past in a few weeks and repeat his lipids in about 3 months.  If he is at target or near target then he will continue on these meds he can follow-up with Dr. [name] and see me back as needed.  Thanks again for the kind referral.  Pixie Casino, MD, FACC, [facility] Director of the [facility]  [facility] Diplomate of the American Board of Clinical Lipidology Attending Cardiologist  Direct Dial: [phone]  Fax: [phone]  Website:  [email or web address]  Nadean Corwin Ronan Dion 08/22/2019, 2:26 PM.

## 2019-08-22 NOTE — Patient Instructions (Signed)
Medication Instructions:  BEGIN TAKING ZETIA (ezetimibe) 10 MG DAILY= 1 TABLET DAILY *If you need a refill on your cardiac medications before your next appointment, please call your pharmacy*  Lab Work: LIVER ENZYMES IN 2-3 WEEKS FASTING LIPID PANEL IN 3 MONTHS If you have labs (blood work) drawn today and your tests are completely normal, you will receive your results only by: Marland Kitchen MyChart Message (if you have MyChart) OR . A paper copy in the mail If you have any lab test that is abnormal or we need to change your treatment, we will call you to review the results.    Follow-Up: FOLLOW UP AS NEEDED WITH DR. HILTY

## 2019-09-05 ENCOUNTER — Ambulatory Visit: Payer: Medicare PPO

## 2019-09-14 ENCOUNTER — Ambulatory Visit: Payer: Medicare PPO | Attending: Internal Medicine

## 2019-09-14 DIAGNOSIS — Z23 Encounter for immunization: Secondary | ICD-10-CM | POA: Insufficient documentation

## 2019-09-14 NOTE — Progress Notes (Signed)
   [id number]  Name:  Alfred Thomas.    MRN: [id number] DOB: 08-14-1950  09/14/2019  Alfred Thomas was observed post Covid-19 immunization for 15 minutes without incidence. He was provided with Vaccine Information Sheet and instruction to access the V-Safe system.   Alfred Thomas was instructed to call 911 with any severe reactions post vaccine: Marland Kitchen Difficulty breathing  . Swelling of your face and throat  . A fast heartbeat  . A bad rash all over your body  . Dizziness and weakness    Immunizations Administered    Name Date Dose VIS Date Route   Pfizer COVID-19 Vaccine 09/14/2019  8:24 AM 0.3 mL 07/20/2019 Intramuscular   Manufacturer: [place]   Lot: [id number]   [place]: [id number]

## 2019-09-26 ENCOUNTER — Ambulatory Visit: Payer: Medicare PPO

## 2019-10-03 LAB — HEPATIC FUNCTION PANEL
ALT: 24 IU/L (ref 0–44)
AST: 28 IU/L (ref 0–40)
Albumin: 4.5 g/dL (ref 3.8–4.8)
Alkaline Phosphatase: 77 IU/L (ref 39–117)
Bilirubin Total: 0.5 mg/dL (ref 0.0–1.2)
Bilirubin, Direct: 0.12 mg/dL (ref 0.00–0.40)
Total Protein: 7.5 g/dL (ref 6.0–8.5)

## 2019-10-10 ENCOUNTER — Ambulatory Visit: Payer: Medicare PPO | Attending: Internal Medicine

## 2019-10-10 DIAGNOSIS — Z23 Encounter for immunization: Secondary | ICD-10-CM | POA: Insufficient documentation

## 2019-10-10 NOTE — Progress Notes (Signed)
   [id number]  Name:  Hilmon Gradney.    MRN: [id number] DOB: 27-Jun-1951  10/10/2019  Mr. Nastri was observed post Covid-19 immunization for 15 minutes without incident. He was provided with Vaccine Information Sheet and instruction to access the V-Safe system.   Mr. Ridley was instructed to call 911 with any severe reactions post vaccine: Marland Kitchen Difficulty breathing  . Swelling of face and throat  . A fast heartbeat  . A bad rash all over body  . Dizziness and weakness   Immunizations Administered    Name Date Dose VIS Date Route   Pfizer COVID-19 Vaccine 10/10/2019  8:15 AM 0.3 mL 07/20/2019 Intramuscular   Manufacturer: [place]   Lot: [id number]   [place]: [id number]

## 2019-10-16 ENCOUNTER — Encounter: Payer: Self-pay | Admitting: Internal Medicine

## 2019-11-30 ENCOUNTER — Encounter: Payer: Medicare PPO | Admitting: Internal Medicine

## 2019-12-05 ENCOUNTER — Other Ambulatory Visit: Payer: Self-pay

## 2019-12-05 ENCOUNTER — Ambulatory Visit (AMBULATORY_SURGERY_CENTER): Payer: Self-pay

## 2019-12-05 VITALS — Temp 97.7°F | Ht 76.0 in | Wt 212.7 lb

## 2019-12-05 DIAGNOSIS — Z86010 Personal history of colonic polyps: Secondary | ICD-10-CM

## 2019-12-05 MED ORDER — NA SULFATE-K SULFATE-MG SULF 17.5-3.13-1.6 GM/177ML PO SOLN: 1.0000 | Freq: Once | ORAL | 0 refills | Status: AC

## 2019-12-05 NOTE — Progress Notes (Signed)
No allergies to soy or egg Pt is not on blood thinners or diet pills Denies issues with sedation/intubation Denies atrial flutter/fib Denies constipation   Emmi instructions given to pt  Pt is aware of Covid safety and care partner requirements.  

## 2019-12-14 ENCOUNTER — Encounter: Payer: Self-pay | Admitting: Internal Medicine

## 2019-12-19 ENCOUNTER — Encounter: Payer: Self-pay | Admitting: Internal Medicine

## 2019-12-19 ENCOUNTER — Other Ambulatory Visit: Payer: Self-pay

## 2019-12-19 ENCOUNTER — Ambulatory Visit (AMBULATORY_SURGERY_CENTER): Payer: Medicare PPO | Admitting: Internal Medicine

## 2019-12-19 VITALS — BP 140/85 | HR 62 | Temp 97.5°F | Resp 13 | Ht 76.0 in | Wt 212.7 lb

## 2019-12-19 DIAGNOSIS — D122 Benign neoplasm of ascending colon: Secondary | ICD-10-CM | POA: Diagnosis not present

## 2019-12-19 DIAGNOSIS — D120 Benign neoplasm of cecum: Secondary | ICD-10-CM

## 2019-12-19 DIAGNOSIS — E785 Hyperlipidemia, unspecified: Secondary | ICD-10-CM | POA: Diagnosis not present

## 2019-12-19 DIAGNOSIS — D124 Benign neoplasm of descending colon: Secondary | ICD-10-CM

## 2019-12-19 DIAGNOSIS — Z86010 Personal history of colonic polyps: Secondary | ICD-10-CM | POA: Diagnosis not present

## 2019-12-19 MED ORDER — SODIUM CHLORIDE 0.9 % IV SOLN: 500.0000 mL | Freq: Once | INTRAVENOUS | Status: DC

## 2019-12-19 NOTE — Progress Notes (Signed)
Called to room to assist during endoscopic procedure.  Patient ID and intended procedure confirmed with present staff. Received instructions for my participation in the procedure from the performing physician.  

## 2019-12-19 NOTE — Patient Instructions (Signed)
Please read handouts provided. Continue present medications. Await pathology results.   YOU HAD AN ENDOSCOPIC PROCEDURE TODAY AT THE [facility]:   Refer to the procedure report that was given to you for any specific questions about what was found during the examination.  If the procedure report does not answer your questions, please call your gastroenterologist to clarify.  If you requested that your care partner not be given the details of your procedure findings, then the procedure report has been included in a sealed envelope for you to review at your convenience later.  YOU SHOULD EXPECT: Some feelings of bloating in the abdomen. Passage of more gas than usual.  Walking can help get rid of the air that was put into your GI tract during the procedure and reduce the bloating. If you had a lower endoscopy (such as a colonoscopy or flexible sigmoidoscopy) you may notice spotting of blood in your stool or on the toilet paper. If you underwent a bowel prep for your procedure, you may not have a normal bowel movement for a few days.  Please Note:  You might notice some irritation and congestion in your nose or some drainage.  This is from the oxygen used during your procedure.  There is no need for concern and it should clear up in a day or so.  SYMPTOMS TO REPORT IMMEDIATELY:  Following lower endoscopy (colonoscopy or flexible sigmoidoscopy):  Excessive amounts of blood in the stool  Significant tenderness or worsening of abdominal pains  Swelling of the abdomen that is new, acute  Fever of 100F or higher   For urgent or emergent issues, a gastroenterologist can be reached at any hour by calling [phone]. Do not use MyChart messaging for urgent concerns.    DIET:  We do recommend a small meal at first, but then you may proceed to your regular diet.  Drink plenty of fluids but you should avoid alcoholic beverages for 24 hours.  ACTIVITY:  You should plan to take it easy  for the rest of today and you should NOT DRIVE or use heavy machinery until tomorrow (because of the sedation medicines used during the test).    FOLLOW UP: Our staff will call the number listed on your records 48-72 hours following your procedure to check on you and address any questions or concerns that you may have regarding the information given to you following your procedure. If we do not reach you, we will leave a message.  We will attempt to reach you two times.  During this call, we will ask if you have developed any symptoms of COVID 19. If you develop any symptoms (ie: fever, flu-like symptoms, shortness of breath, cough etc.) before then, please call [phone].  If you test positive for Covid 19 in the 2 weeks post procedure, please call and report this information to us.    If any biopsies were taken you will be contacted by phone or by letter within the next 1-3 weeks.  Please call us at [phone] if you have not heard about the biopsies in 3 weeks.    SIGNATURES/CONFIDENTIALITY: You and/or your care partner have signed paperwork which will be entered into your electronic medical record.  These signatures attest to the fact that that the information above on your After Visit Summary has been reviewed and is understood.  Full responsibility of the confidentiality of this discharge information lies with you and/or your care-partner.  

## 2019-12-19 NOTE — Progress Notes (Signed)
Pt's states no medical or surgical changes since previsit or office visit.  Temp-LS  V/S-CW

## 2019-12-19 NOTE — Op Note (Addendum)
[facility] Patient Name: Bralin Shehee Procedure Date: 12/19/2019 8:53 AM MRN: [id number] Endoscopist: Docia Chuck. Henrene Pastor , MD Age: 69 Referring MD:  Date of Birth: 11-29-1950 Gender: Male Account #: [id number] Procedure:                Colonoscopy with cold snare polypectomy x 5 Indications:              High risk colon cancer surveillance: Personal                            history of multiple (3 or more) adenomas. Prior                            exams 2004, 2009, 2016 Medicines:                Monitored Anesthesia Care Procedure:                Pre-Anesthesia Assessment:                           - Prior to the procedure, a History and Physical                            was performed, and patient medications and                            allergies were reviewed. The patient's tolerance of                            previous anesthesia was also reviewed. The risks                            and benefits of the procedure and the sedation                            options and risks were discussed with the patient.                            All questions were answered, and informed consent                            was obtained. Prior Anticoagulants: The patient has                            taken no previous anticoagulant or antiplatelet                            agents. ASA Grade Assessment: II - A patient with                            mild systemic disease. After reviewing the risks                            and benefits, the patient was deemed in  satisfactory condition to undergo the procedure.                           After obtaining informed consent, the colonoscope                            was passed under direct vision. Throughout the                            procedure, the patient's blood pressure, pulse, and                            oxygen saturations were monitored continuously. The                            Colonoscope  was introduced through the anus and                            advanced to the the cecum, identified by                            appendiceal orifice and ileocecal valve. The                            ileocecal valve, appendiceal orifice, and rectum                            were photographed. The quality of the bowel                            preparation was excellent. The colonoscopy was                            performed without difficulty. The patient tolerated                            the procedure well. The bowel preparation used was                            SUPREP via split dose instruction. Scope In: 8:58:43 AM Scope Out: 9:19:35 AM Scope Withdrawal Time: 0 hours 16 minutes 38 seconds  Total Procedure Duration: 0 hours 20 minutes 52 seconds  Findings:                 Five polyps were found in the descending colon,                            ascending colon and cecum. The polyps were 3 to 5                            mm in size. These polyps were removed with a cold                            snare. Resection and retrieval were  complete.                           Multiple diverticula were found in the entire colon.                           Internal hemorrhoids were found during retroflexion.                           The exam was otherwise without abnormality on                            direct and retroflexion views. Complications:            No immediate complications. Estimated blood loss:                            None. Estimated Blood Loss:     Estimated blood loss: none. Impression:               - Five 3 to 5 mm polyps in the descending colon, in                            the ascending colon and in the cecum, removed with                            a cold snare. Resected and retrieved.                           - Diverticulosis in the entire examined colon.                           - Internal hemorrhoids.                           - The examination was  otherwise normal on direct                            and retroflexion views. Recommendation:           - Repeat colonoscopy in 3 years for surveillance.                           - Patient has a contact number available for                            emergencies. The signs and symptoms of potential                            delayed complications were discussed with the                            patient. Return to normal activities tomorrow.                            Written discharge instructions were provided to the  patient.                           - Resume previous diet.                           - Continue present medications.                           - Await pathology results. Docia Chuck. Henrene Pastor, MD 12/19/2019 9:26:47 AM This report has been signed electronically.

## 2019-12-19 NOTE — Progress Notes (Signed)
Report to PACU, RN, vss, BBS= Clear.  

## 2019-12-21 ENCOUNTER — Telehealth: Payer: Self-pay | Admitting: *Deleted

## 2019-12-21 ENCOUNTER — Encounter: Payer: Self-pay | Admitting: Internal Medicine

## 2019-12-21 NOTE — Telephone Encounter (Signed)
Attempted f/u phone call. No answer. Left message. °

## 2019-12-21 NOTE — Telephone Encounter (Signed)
  Follow up Call-  Call back number 12/19/2019  Post procedure Call Back phone  # [phone]  Permission to leave phone message Yes  Some recent data might be hidden   LMOM to call back with any questions or concerns.  Also, call back if patient has developed fever, respiratory issues or been dx with COVID or had any family members or close contacts diagnosed since her procedure.

## 2020-02-27 DIAGNOSIS — L821 Other seborrheic keratosis: Secondary | ICD-10-CM | POA: Diagnosis not present

## 2020-02-27 DIAGNOSIS — Z85820 Personal history of malignant melanoma of skin: Secondary | ICD-10-CM | POA: Diagnosis not present

## 2020-02-27 DIAGNOSIS — Z85828 Personal history of other malignant neoplasm of skin: Secondary | ICD-10-CM | POA: Diagnosis not present

## 2020-02-27 DIAGNOSIS — L570 Actinic keratosis: Secondary | ICD-10-CM | POA: Diagnosis not present

## 2020-05-08 DIAGNOSIS — H2513 Age-related nuclear cataract, bilateral: Secondary | ICD-10-CM | POA: Diagnosis not present

## 2020-07-16 ENCOUNTER — Encounter: Payer: Self-pay | Admitting: Cardiology

## 2020-07-16 ENCOUNTER — Other Ambulatory Visit: Payer: Self-pay

## 2020-07-16 ENCOUNTER — Ambulatory Visit: Payer: Medicare PPO | Admitting: Cardiology

## 2020-07-16 VITALS — BP 120/80 | HR 66 | Ht 76.0 in | Wt 216.0 lb

## 2020-07-16 DIAGNOSIS — E7800 Pure hypercholesterolemia, unspecified: Secondary | ICD-10-CM | POA: Diagnosis not present

## 2020-07-16 DIAGNOSIS — I6523 Occlusion and stenosis of bilateral carotid arteries: Secondary | ICD-10-CM

## 2020-07-16 DIAGNOSIS — E785 Hyperlipidemia, unspecified: Secondary | ICD-10-CM

## 2020-07-16 DIAGNOSIS — I480 Paroxysmal atrial fibrillation: Secondary | ICD-10-CM | POA: Diagnosis not present

## 2020-07-16 NOTE — Patient Instructions (Signed)
Medication Instructions:  Your physician recommends that you continue on your current medications as directed. Please refer to the Current Medication list given to you today.  *If you need a refill on your cardiac medications before your next appointment, please call your pharmacy*   Lab Work: None Ordered If you have labs (blood work) drawn today and your tests are completely normal, you will receive your results only by: . MyChart Message (if you have MyChart) OR . A paper copy in the mail If you have any lab test that is abnormal or we need to change your treatment, we will call you to review the results.    Testing/Procedures: None Ordered    Follow-Up: At [facility], you and your health needs are our priority.  As part of our continuing mission to provide you with exceptional heart care, we have created designated Provider Care Teams.  These Care Teams include your primary Cardiologist (physician) and Advanced Practice Providers (APPs -  Physician Assistants and Nurse Practitioners) who all work together to provide you with the care you need, when you need it.   Your next appointment:   1 year(s)  The format for your next appointment:   In Person  Provider:   You may see Mark Skains, MD or one of the following Advanced Practice Providers on your designated Care Team:    Lori Gerhardt, NP  Laura Ingold, NP  Jill McDaniel, NP     

## 2020-07-16 NOTE — Progress Notes (Signed)
Cardiology Office Note:    Date:  07/16/2020   ID:  Alfred Thomas., DOB 06-Feb-1951, MRN [id number]  PCP:  Alfred Manes, MD  [facility] Cardiologist:  Alfred Furbish, MD  [facility] Electrophysiologist:  None   Referring MD: Alfred Manes, MD    History of Present Illness:    Alfred Thomas. is a 69 y.o. male here for the follow-up of hyperlipidemia.  Appreciate prior consult note by Dr. Debara Thomas on 08/22/2019.  Paroxysmal atrial fibrillation.  Last episode was 08/13/2015 where he had rapid tachycardia with accompanying hypotension.  Atrial fibrillation was briefly detected on a monitor for approximately 3 minutes in duration. Has not felt anything since on siltiazem. Rare skip.   Crestor in the past has caused liver enzyme abnormalities.  Atorvastatin caused muscle aches.  Pravastatin 40 mg was tolerated but LDL was 147.  Also had mild carotid atherosclerosis on vascular imaging.  No stroke.  Enjoys golf, trapshooting.  Past Medical History:  Diagnosis Date  . Atrial fibrillation ([place])   . Cancer ([place])    melanoma skin cancer shoulder  . Diverticulitis   . H/O echocardiogram   . Hyperglycemia   . Hyperlipidemia   . Palpitations     Past Surgical History:  Procedure Laterality Date  . COLONOSCOPY     x2; last 2016  . POLYPECTOMY    . TONSILLECTOMY    . WISDOM TOOTH EXTRACTION      Current Medications: Current Meds  Medication Sig  . aspirin EC 81 MG tablet Take 81 mg by mouth daily.  . Coenzyme Q10 (CO Q 10 PO) Take by mouth daily.  Marland Kitchen diltiazem (CARDIZEM CD) 180 MG 24 hr capsule Take 1 capsule (180 mg total) by mouth daily.  Marland Kitchen ezetimibe (ZETIA) 10 MG tablet Take 1 tablet (10 mg total) by mouth daily.  Marland Kitchen loratadine (CLARITIN) 10 MG tablet Take 10 mg by mouth daily.  . pravastatin (PRAVACHOL) 40 MG tablet Take 40 mg by mouth every other day.   Current Facility-Administered Medications for the 07/16/20 encounter (Office Visit) with Jerline Pain, MD   Medication  . 0.9 %  sodium chloride infusion     Allergies:   Crestor [rosuvastatin] and Lipitor [atorvastatin]   Social History   Socioeconomic History  . Marital status: Married    Spouse name: Not on file  . Number of children: Not on file  . Years of education: Not on file  . Highest education level: Not on file  Occupational History  . Not on file  Tobacco Use  . Smoking status: Never Smoker  . Smokeless tobacco: Never Used  Vaping Use  . Vaping Use: Never used  Substance and Sexual Activity  . Alcohol use: Yes    Alcohol/week: 0.0 standard drinks    Comment: socially  . Drug use: No  . Sexual activity: Not on file  Other Topics Concern  . Not on file  Social History Narrative  . Not on file   Social Determinants of Health   Financial Resource Strain:   . Difficulty of Paying Living Expenses: Not on file  Food Insecurity:   . Worried About Charity fundraiser in the Last Year: Not on file  . Ran Out of Food in the Last Year: Not on file  Transportation Needs:   . Lack of Transportation (Medical): Not on file  . Lack of Transportation (Non-Medical): Not on file  Physical Activity:   . Days of Exercise per  Week: Not on file  . Minutes of Exercise per Session: Not on file  Stress:   . Feeling of Stress : Not on file  Social Connections:   . Frequency of Communication with Friends and Family: Not on file  . Frequency of Social Gatherings with Friends and Family: Not on file  . Attends Religious Services: Not on file  . Active Member of Clubs or Organizations: Not on file  . Attends Archivist Meetings: Not on file  . Marital Status: Not on file     Family History: The patient's family history includes Coronary artery disease in his mother; Emphysema in his father; Heart attack in his mother; Heart disease in his father and mother. There is no history of Colon cancer, Colon polyps, Esophageal cancer, Rectal cancer, or Stomach cancer.  ROS:    Please see the history of present illness.   Denies any chest pain fevers chills nausea vomiting syncope bleeding  All other systems reviewed and are negative.  EKGs/Labs/Other Studies Reviewed:    The following studies were reviewed today:  ECHO 08/22/15 -   -Left ventricle: The cavity size was normal. There was mild concentric hypertrophy. Systolic function was vigorous. The estimated ejection fraction was in the range of 65% to 70%. Wall motion was normal; there were no regional wall motion abnormalities. The study is not technically sufficient to allow evaluation of LV diastolic function. - Mitral valve: There was trivial regurgitation. - Left atrium: The atrium was mildly dilated. - Right ventricle: The cavity size was normal. Wall thickness was normal. Systolic function was normal. - Inferior vena cava: The vessel was normal in size. The respirophasic diameter changes were in the normal range (>= 50%), consistent with normal central venous pressure.  Negative nuclear stress test 06/2011  EKG:  EKG is  ordered today.  The ekg ordered today demonstrates sinus rhythm 66 no other abnormalities.  Recent Labs: 10/03/2019: ALT 24  Recent Lipid Panel No results found for: CHOL, TRIG, HDL, CHOLHDL, VLDL, LDLCALC, LDLDIRECT   Risk Assessment/Calculations:      Physical Exam:    VS:  BP 120/80 (BP Location: Left Arm, Patient Position: Sitting, Cuff Size: Normal)   Pulse 66   Ht 6\' 4"  (1.93 m)   Wt 216 lb (98 kg)   SpO2 95%   BMI 26.29 kg/m     Wt Readings from Last 3 Encounters:  07/16/20 216 lb (98 kg)  12/19/19 212 lb 11.2 oz (96.5 kg)  12/05/19 212 lb 11.2 oz (96.5 kg)     GEN:  Well nourished, well developed in no acute distress HEENT: Normal NECK: No JVD; No carotid bruits LYMPHATICS: No lymphadenopathy CARDIAC: RRR, no murmurs, rubs, gallops RESPIRATORY:  Clear to auscultation without rales, wheezing or rhonchi  ABDOMEN: Soft, non-tender,  non-distended MUSCULOSKELETAL:  No edema; No deformity  SKIN: Warm and dry NEUROLOGIC:  Alert and oriented x 3 PSYCHIATRIC:  Normal affect   ASSESSMENT:    1. Bilateral carotid artery stenosis   2. Dyslipidemia, goal LDL below 100   3. PAF (paroxysmal atrial fibrillation) ([place])   4. Pure hypercholesterolemia    PLAN:    In order of problems listed above:  Hyperlipidemia -There was in addition of Zetia to his pravastatin..  Liver enzymes are being monitored.  Last ALT was 24.  Paroxysmal atrial fibrillation -No recent occurrence. -CHADSVASc now 2 given his carotid vascular plaque. -Prior episode was in the setting of a stomach virus.  Has not had  any further recurrences.  Of course if atrial fibrillation were to return, anticoagulation therapy would be warranted.  Prior vagal type symptoms -Stay hydrated.  No further symptoms.  Carotid artery plaque mild bilaterally in 2019.    Shared Decision Making/Informed Consent        Medication Adjustments/Labs and Tests Ordered: Current medicines are reviewed at length with the patient today.  Concerns regarding medicines are outlined above.  Orders Placed This Encounter  Procedures  . EKG 12-Lead   No orders of the defined types were placed in this encounter.   Patient Instructions  Medication Instructions:  Your physician recommends that you continue on your current medications as directed. Please refer to the Current Medication list given to you today.  *If you need a refill on your cardiac medications before your next appointment, please call your pharmacy*   Lab Work: None Ordered If you have labs (blood work) drawn today and your tests are completely normal, you will receive your results only by: Marland Kitchen MyChart Message (if you have MyChart) OR . A paper copy in the mail If you have any lab test that is abnormal or we need to change your treatment, we will call you to review the results.   Testing/Procedures: None  Ordered    Follow-Up: At [facility], you and your health needs are our priority.  As part of our continuing mission to provide you with exceptional heart care, we have created designated Provider Care Teams.  These Care Teams include your primary Cardiologist (physician) and Advanced Practice Providers (APPs -  Physician Assistants and Nurse Practitioners) who all work together to provide you with the care you need, when you need it.   Your next appointment:   1 year(s)  The format for your next appointment:   In Person  Provider:   You may see Alfred Furbish, MD or one of the following Advanced Practice Providers on your designated Care Team:    Truitt Merle, NP  Cecilie Kicks, NP  Kathyrn Drown, NP         Signed, Alfred Furbish, MD  07/16/2020 2:40 PM    [place]

## 2020-07-30 DIAGNOSIS — J029 Acute pharyngitis, unspecified: Secondary | ICD-10-CM | POA: Diagnosis not present

## 2020-08-16 ENCOUNTER — Other Ambulatory Visit: Payer: Self-pay | Admitting: Cardiology

## 2020-08-16 DIAGNOSIS — Z1152 Encounter for screening for COVID-19: Secondary | ICD-10-CM | POA: Diagnosis not present

## 2020-09-03 DIAGNOSIS — C44329 Squamous cell carcinoma of skin of other parts of face: Secondary | ICD-10-CM | POA: Diagnosis not present

## 2020-09-03 DIAGNOSIS — Z85820 Personal history of malignant melanoma of skin: Secondary | ICD-10-CM | POA: Diagnosis not present

## 2020-09-03 DIAGNOSIS — L821 Other seborrheic keratosis: Secondary | ICD-10-CM | POA: Diagnosis not present

## 2020-09-03 DIAGNOSIS — Z85828 Personal history of other malignant neoplasm of skin: Secondary | ICD-10-CM | POA: Diagnosis not present

## 2020-09-03 DIAGNOSIS — D485 Neoplasm of uncertain behavior of skin: Secondary | ICD-10-CM | POA: Diagnosis not present

## 2020-09-03 DIAGNOSIS — L570 Actinic keratosis: Secondary | ICD-10-CM | POA: Diagnosis not present

## 2020-09-24 DIAGNOSIS — Z85820 Personal history of malignant melanoma of skin: Secondary | ICD-10-CM | POA: Diagnosis not present

## 2020-09-24 DIAGNOSIS — L988 Other specified disorders of the skin and subcutaneous tissue: Secondary | ICD-10-CM | POA: Diagnosis not present

## 2020-09-24 DIAGNOSIS — C44329 Squamous cell carcinoma of skin of other parts of face: Secondary | ICD-10-CM | POA: Diagnosis not present

## 2020-09-24 DIAGNOSIS — Z85828 Personal history of other malignant neoplasm of skin: Secondary | ICD-10-CM | POA: Diagnosis not present

## 2020-11-05 DIAGNOSIS — Z0000 Encounter for general adult medical examination without abnormal findings: Secondary | ICD-10-CM | POA: Diagnosis not present

## 2020-11-05 DIAGNOSIS — E7800 Pure hypercholesterolemia, unspecified: Secondary | ICD-10-CM | POA: Diagnosis not present

## 2020-11-05 DIAGNOSIS — Z1159 Encounter for screening for other viral diseases: Secondary | ICD-10-CM | POA: Diagnosis not present

## 2020-11-05 DIAGNOSIS — D696 Thrombocytopenia, unspecified: Secondary | ICD-10-CM | POA: Diagnosis not present

## 2020-11-05 DIAGNOSIS — Z79899 Other long term (current) drug therapy: Secondary | ICD-10-CM | POA: Diagnosis not present

## 2020-11-05 DIAGNOSIS — I480 Paroxysmal atrial fibrillation: Secondary | ICD-10-CM | POA: Diagnosis not present

## 2020-11-05 DIAGNOSIS — Z1389 Encounter for screening for other disorder: Secondary | ICD-10-CM | POA: Diagnosis not present

## 2020-11-12 DIAGNOSIS — D696 Thrombocytopenia, unspecified: Secondary | ICD-10-CM | POA: Diagnosis not present

## 2020-11-12 DIAGNOSIS — Z1159 Encounter for screening for other viral diseases: Secondary | ICD-10-CM | POA: Diagnosis not present

## 2020-11-12 DIAGNOSIS — E7800 Pure hypercholesterolemia, unspecified: Secondary | ICD-10-CM | POA: Diagnosis not present

## 2020-11-12 DIAGNOSIS — Z79899 Other long term (current) drug therapy: Secondary | ICD-10-CM | POA: Diagnosis not present

## 2021-01-20 DIAGNOSIS — E7800 Pure hypercholesterolemia, unspecified: Secondary | ICD-10-CM | POA: Diagnosis not present

## 2021-01-20 DIAGNOSIS — M79602 Pain in left arm: Secondary | ICD-10-CM | POA: Diagnosis not present

## 2021-01-20 DIAGNOSIS — H9011 Conductive hearing loss, unilateral, right ear, with unrestricted hearing on the contralateral side: Secondary | ICD-10-CM | POA: Diagnosis not present

## 2021-01-28 DIAGNOSIS — H9011 Conductive hearing loss, unilateral, right ear, with unrestricted hearing on the contralateral side: Secondary | ICD-10-CM | POA: Diagnosis not present

## 2021-01-28 DIAGNOSIS — H6123 Impacted cerumen, bilateral: Secondary | ICD-10-CM | POA: Diagnosis not present

## 2021-03-04 DIAGNOSIS — C44722 Squamous cell carcinoma of skin of right lower limb, including hip: Secondary | ICD-10-CM | POA: Diagnosis not present

## 2021-03-04 DIAGNOSIS — Z85820 Personal history of malignant melanoma of skin: Secondary | ICD-10-CM | POA: Diagnosis not present

## 2021-03-04 DIAGNOSIS — D485 Neoplasm of uncertain behavior of skin: Secondary | ICD-10-CM | POA: Diagnosis not present

## 2021-03-04 DIAGNOSIS — L570 Actinic keratosis: Secondary | ICD-10-CM | POA: Diagnosis not present

## 2021-03-04 DIAGNOSIS — L821 Other seborrheic keratosis: Secondary | ICD-10-CM | POA: Diagnosis not present

## 2021-03-04 DIAGNOSIS — L814 Other melanin hyperpigmentation: Secondary | ICD-10-CM | POA: Diagnosis not present

## 2021-03-04 DIAGNOSIS — C44729 Squamous cell carcinoma of skin of left lower limb, including hip: Secondary | ICD-10-CM | POA: Diagnosis not present

## 2021-03-04 DIAGNOSIS — Z85828 Personal history of other malignant neoplasm of skin: Secondary | ICD-10-CM | POA: Diagnosis not present

## 2021-03-11 DIAGNOSIS — H6983 Other specified disorders of Eustachian tube, bilateral: Secondary | ICD-10-CM | POA: Diagnosis not present

## 2021-03-11 DIAGNOSIS — H903 Sensorineural hearing loss, bilateral: Secondary | ICD-10-CM | POA: Diagnosis not present

## 2021-03-11 DIAGNOSIS — H608X3 Other otitis externa, bilateral: Secondary | ICD-10-CM | POA: Diagnosis not present

## 2021-09-09 DIAGNOSIS — L821 Other seborrheic keratosis: Secondary | ICD-10-CM | POA: Diagnosis not present

## 2021-09-09 DIAGNOSIS — D485 Neoplasm of uncertain behavior of skin: Secondary | ICD-10-CM | POA: Diagnosis not present

## 2021-09-09 DIAGNOSIS — C4361 Malignant melanoma of right upper limb, including shoulder: Secondary | ICD-10-CM | POA: Diagnosis not present

## 2021-09-09 DIAGNOSIS — Z85820 Personal history of malignant melanoma of skin: Secondary | ICD-10-CM | POA: Diagnosis not present

## 2021-09-09 DIAGNOSIS — Z85828 Personal history of other malignant neoplasm of skin: Secondary | ICD-10-CM | POA: Diagnosis not present

## 2021-09-09 DIAGNOSIS — L570 Actinic keratosis: Secondary | ICD-10-CM | POA: Diagnosis not present

## 2021-09-09 DIAGNOSIS — D0439 Carcinoma in situ of skin of other parts of face: Secondary | ICD-10-CM | POA: Diagnosis not present

## 2021-09-11 ENCOUNTER — Other Ambulatory Visit: Payer: Self-pay | Admitting: Cardiology

## 2021-09-15 NOTE — Progress Notes (Signed)
Cardiology Office Note    Date:  09/23/2021   ID:  Alfred Field Dylon Correa., DOB 1950/08/12, MRN [id number]   PCP:  Lajean Manes, MD   [place]  Cardiologist:  Candee Furbish, MD   Advanced Practice Provider:  No care team member to display Electrophysiologist:  None   [id number]   Chief Complaint  Patient presents with   Follow-up    History of Present Illness:  Alfred Crill. is a 71 y.o. male with history of PAF 2017 in setting of GI virus-not anticoagulated because of single occurrence, HLD statin intolerant, carotid atherosclerosis 2019.  Last saw Dr. [name] 07/16/20 and zetia added to pravastatin and monitoring liver enzymes.  Patient comes in for f/u. PCP tried to increase pravachol to 80 mg but patient didn't tolerate. He has an occasional sensation of a skipped beat but very infrequent. No racing or irreg heart beat. Drinks 1 1/2 cups coffee daily, coke 0-daily. Walks dog 20-30 min daily and goes to [facility] 3 days/week-weights. No chest pain, dyspnea, dizziness or presyncope. Younger brother had CABG in January.   Past Medical History:  Diagnosis Date   Atrial fibrillation ([place])    Cancer ([place])    melanoma skin cancer shoulder   Diverticulitis    H/O echocardiogram    Hyperglycemia    Hyperlipidemia    Palpitations     Past Surgical History:  Procedure Laterality Date   COLONOSCOPY     x2; last 2016   POLYPECTOMY     TONSILLECTOMY     WISDOM TOOTH EXTRACTION      Current Medications: Current Meds  Medication Sig   aspirin EC 81 MG tablet Take 81 mg by mouth daily.   Coenzyme Q10 (CO Q 10 PO) Take by mouth daily.   diltiazem (CARDIZEM CD) 180 MG 24 hr capsule TAKE 1 CAPSULE BY MOUTH EVERY DAY   ezetimibe (ZETIA) 10 MG tablet Take 1 tablet (10 mg total) by mouth daily.   loratadine (CLARITIN) 10 MG tablet Take 10 mg by mouth daily.   pravastatin (PRAVACHOL) 40 MG tablet Take 40 mg by mouth daily.   Current  Facility-Administered Medications for the 09/23/21 encounter (Office Visit) with Imogene Burn, PA-C  Medication   0.9 %  sodium chloride infusion     Allergies:   Crestor [rosuvastatin] and Lipitor [atorvastatin]   Social History   Socioeconomic History   Marital status: Married    Spouse name: Not on file   Number of children: Not on file   Years of education: Not on file   Highest education level: Not on file  Occupational History   Not on file  Tobacco Use   Smoking status: Never   Smokeless tobacco: Never  Vaping Use   Vaping Use: Never used  Substance and Sexual Activity   Alcohol use: Yes    Alcohol/week: 0.0 standard drinks    Comment: socially   Drug use: No   Sexual activity: Not on file  Other Topics Concern   Not on file  Social History Narrative   Not on file   Social Determinants of Health   Financial Resource Strain: Not on file  Food Insecurity: Not on file  Transportation Needs: Not on file  Physical Activity: Not on file  Stress: Not on file  Social Connections: Not on file     Family History:  The patient's  family history includes Coronary artery disease in his mother; Emphysema in his  father; Heart attack in his mother; Heart disease in his father and mother.   ROS:   Please see the history of present illness.    ROS All other systems reviewed and are negative.   PHYSICAL EXAM:   VS:  BP 120/80 (BP Location: Left Arm, Patient Position: Sitting, Cuff Size: Normal)    Pulse 77    Ht 6\' 4"  (1.93 m)    Wt 215 lb (97.5 kg)    SpO2 93%    BMI 26.17 kg/m   Physical Exam  GEN: Well nourished, well developed, in no acute distress  Neck: no JVD, carotid bruits, or masses Cardiac:RRR; no murmurs, rubs, or gallops  Respiratory:  clear to auscultation bilaterally, normal work of breathing GI: soft, nontender, nondistended, + BS Ext: without cyanosis, clubbing, or edema, Good distal pulses bilaterally Neuro:  Alert and Oriented x 3 Psych: euthymic  mood, full affect  Wt Readings from Last 3 Encounters:  09/23/21 215 lb (97.5 kg)  07/16/20 216 lb (98 kg)  12/19/19 212 lb 11.2 oz (96.5 kg)      Studies/Labs Reviewed:   EKG:  EKG is  ordered today.  The ekg ordered today demonstrates NSR normal EKG  Recent Labs: No results found for requested labs within last 8760 hours.   Lipid Panel No results found for: CHOL, TRIG, HDL, CHOLHDL, VLDL, LDLCALC, LDLDIRECT  Additional studies/ records that were reviewed today include:  Carotid dopplers 10/12/17  Final Interpretation:  Right Carotid: Velocities in the right ICA are consistent with a 1-39%  stenosis.   Left Carotid: Velocities in the left ICA are consistent with a 1-39%  stenosis.   Vertebrals: Both vertebral arteries were patent with antegrade flow.  Subclavians: Normal flow hemodynamics were seen in bilateral subclavian               arteries.   *See table(s) above for measurements and observations.      Electronically signed by Ena Dawley on 10/12/2017 at 5:24:44 PM.   Echo 08/22/15 Study Conclusions   - Left ventricle: The cavity size was normal. There was mild    concentric hypertrophy. Systolic function was vigorous. The    estimated ejection fraction was in the range of 65% to 70%. Wall    motion was normal; there were no regional wall motion    abnormalities. The study is not technically sufficient to allow    evaluation of LV diastolic function.  - Mitral valve: There was trivial regurgitation.  - Left atrium: The atrium was mildly dilated.  - Right ventricle: The cavity size was normal. Wall thickness was    normal. Systolic function was normal.  - Inferior vena cava: The vessel was normal in size. The    respirophasic diameter changes were in the normal range (>= 50%),    consistent with normal central venous pressure.      Risk Assessment/Calculations:    CHA2DS2-VASc Score = 2   This indicates a 2.2% annual risk of stroke. The patient's  score is based upon: CHF History: 0 HTN History: 0 Diabetes History: 0 Stroke History: 0 Vascular Disease History: 1 Age Score: 1 Gender Score: 0        ASSESSMENT:    1. Paroxysmal atrial fibrillation (HCC)   2. Other hyperlipidemia   3. Bilateral carotid artery stenosis   4. Family history of early CAD      PLAN:  In order of problems listed above:  PAF in the setting of stomach virus  2017, no recurrence on diltiazem. CHADSVASc = 2 so if will need anticoagulation if recurrence. No recurrence and only a few skips  HLD-many intolerances and elevated LFT's in past last LDL135 01/2021 will order labs-consider referral to lipid clinic.  Carotid atherosclerosis mild 2019  Family history of CAD -younger brother just had CABG and risk factors. Will order Coronary calcium score.     Shared Decision Making/Informed Consent        Medication Adjustments/Labs and Tests Ordered: Current medicines are reviewed at length with the patient today.  Concerns regarding medicines are outlined above.  Medication changes, Labs and Tests ordered today are listed in the Patient Instructions below. Patient Instructions  Medication Instructions:  Your physician recommends that you continue on your current medications as directed. Please refer to the Current Medication list given to you today.  *If you need a refill on your cardiac medications before your next appointment, please call your pharmacy*   Lab Work: WHEN COME IN FOR CALCIUM SCORE, COME FASTING:  CMET, CBC, TSH, LIPID, & LIPOA  If you have labs (blood work) drawn today and your tests are completely normal, you will receive your results only by: [place] (if you have MyChart) OR A paper copy in the mail If you have any lab test that is abnormal or we need to change your treatment, we will call you to review the results.   Testing/Procedures: Ermalinda Barrios, PA-C has ordered a CT coronary calcium score.   Test  locations:  [facility] ([address] [place], [place] [address]) [facility] ([address] [place], [place] [address])   This is $99 out of pocket.   Coronary CalciumScan A coronary calcium scan is an imaging test used to look for deposits of calcium and other fatty materials (plaques) in the inner lining of the blood vessels of the heart (coronary arteries). These deposits of calcium and plaques can partly clog and narrow the coronary arteries without producing any symptoms or warning signs. This puts a person at risk for a heart attack. This test can detect these deposits before symptoms develop. Tell a health care provider about: Any allergies you have. All medicines you are taking, including vitamins, herbs, eye drops, creams, and over-the-counter medicines. Any problems you or family members have had with anesthetic medicines. Any blood disorders you have. Any surgeries you have had. Any medical conditions you have. Whether you are pregnant or may be pregnant. What are the risks? Generally, this is a safe procedure. However, problems may occur, including: Harm to a pregnant woman and her unborn baby. This test involves the use of radiation. Radiation exposure can be dangerous to a pregnant woman and her unborn baby. If you are pregnant, you generally should not have this procedure done. Slight increase in the risk of cancer. This is because of the radiation involved in the test. What happens before the procedure? No preparation is needed for this procedure. What happens during the procedure? You will undress and remove any jewelry around your neck or chest. You will put on a hospital gown. Sticky electrodes will be placed on your chest. The electrodes will be connected to an electrocardiogram (ECG) machine to record a tracing of the electrical activity of your heart. A CT scanner will take pictures of your heart. During this time, you will be asked to lie still  and hold your breath for 2-3 seconds while a picture of your heart is being taken. The procedure may vary among health  care providers and hospitals. What happens after the procedure? You can get dressed. You can return to your normal activities. It is up to you to get the results of your test. Ask your health care provider, or the department that is doing the test, when your results will be ready. Summary A coronary calcium scan is an imaging test used to look for deposits of calcium and other fatty materials (plaques) in the inner lining of the blood vessels of the heart (coronary arteries). Generally, this is a safe procedure. Tell your health care provider if you are pregnant or may be pregnant. No preparation is needed for this procedure. A CT scanner will take pictures of your heart. You can return to your normal activities after the scan is done. This information is not intended to replace advice given to you by your health care provider. Make sure you discuss any questions you have with your health care provider. Document Released: 01/22/2008 Document Revised: 06/14/2016 Document Reviewed: 06/14/2016 Elsevier Interactive Patient Education  2017 [place]: At [facility], you and your health needs are our priority.  As part of our continuing mission to provide you with exceptional heart care, we have created designated Provider Care Teams.  These Care Teams include your primary Cardiologist (physician) and Advanced Practice Providers (APPs -  Physician Assistants and Nurse Practitioners) who all work together to provide you with the care you need, when you need it.  We recommend signing up for the patient portal called "MyChart".  Sign up information is provided on this After Visit Summary.  MyChart is used to connect with patients for Virtual Visits (Telemedicine).  Patients are able to view lab/test results, encounter notes, upcoming appointments, etc.  Non-urgent  messages can be sent to your provider as well.   To learn more about what you can do with MyChart, go to [email or web address].    Your next appointment:   12 month(s)  The format for your next appointment:   In Person  Provider:   Candee Furbish, MD     Other Instructions    Signed, Ermalinda Barrios, PA-C  09/23/2021 11:03 AM    [facility] [place], [place], [place]  [address] Phone: [phone]; Fax: [phone]

## 2021-09-16 DIAGNOSIS — H9041 Sensorineural hearing loss, unilateral, right ear, with unrestricted hearing on the contralateral side: Secondary | ICD-10-CM | POA: Diagnosis not present

## 2021-09-23 ENCOUNTER — Other Ambulatory Visit: Payer: Self-pay

## 2021-09-23 ENCOUNTER — Ambulatory Visit: Payer: Medicare PPO | Admitting: Physician Assistant

## 2021-09-23 ENCOUNTER — Encounter: Payer: Self-pay | Admitting: Physician Assistant

## 2021-09-23 VITALS — BP 120/80 | HR 77 | Ht 76.0 in | Wt 215.0 lb

## 2021-09-23 DIAGNOSIS — I6523 Occlusion and stenosis of bilateral carotid arteries: Secondary | ICD-10-CM | POA: Diagnosis not present

## 2021-09-23 DIAGNOSIS — Z8249 Family history of ischemic heart disease and other diseases of the circulatory system: Secondary | ICD-10-CM

## 2021-09-23 DIAGNOSIS — I480 Paroxysmal atrial fibrillation: Secondary | ICD-10-CM | POA: Diagnosis not present

## 2021-09-23 DIAGNOSIS — E7849 Other hyperlipidemia: Secondary | ICD-10-CM

## 2021-09-23 NOTE — Patient Instructions (Addendum)
Medication Instructions:  Your physician recommends that you continue on your current medications as directed. Please refer to the Current Medication list given to you today.  *If you need a refill on your cardiac medications before your next appointment, please call your pharmacy*   Lab Work: WHEN COME IN FOR CALCIUM SCORE, COME FASTING:  CMET, CBC, TSH, LIPID, & LIPOA  If you have labs (blood work) drawn today and your tests are completely normal, you will receive your results only by: [place] (if you have MyChart) OR A paper copy in the mail If you have any lab test that is abnormal or we need to change your treatment, we will call you to review the results.   Testing/Procedures: Ermalinda Barrios, PA-C has ordered a CT coronary calcium score.   Test locations:  [facility] ([address] [place], [place] [address]) [facility] ([address] [place], [place] [address])   This is $99 out of pocket.   Coronary CalciumScan A coronary calcium scan is an imaging test used to look for deposits of calcium and other fatty materials (plaques) in the inner lining of the blood vessels of the heart (coronary arteries). These deposits of calcium and plaques can partly clog and narrow the coronary arteries without producing any symptoms or warning signs. This puts a person at risk for a heart attack. This test can detect these deposits before symptoms develop. Tell a health care provider about: Any allergies you have. All medicines you are taking, including vitamins, herbs, eye drops, creams, and over-the-counter medicines. Any problems you or family members have had with anesthetic medicines. Any blood disorders you have. Any surgeries you have had. Any medical conditions you have. Whether you are pregnant or may be pregnant. What are the risks? Generally, this is a safe procedure. However, problems may occur, including: Harm to a pregnant woman and her unborn  baby. This test involves the use of radiation. Radiation exposure can be dangerous to a pregnant woman and her unborn baby. If you are pregnant, you generally should not have this procedure done. Slight increase in the risk of cancer. This is because of the radiation involved in the test. What happens before the procedure? No preparation is needed for this procedure. What happens during the procedure? You will undress and remove any jewelry around your neck or chest. You will put on a hospital gown. Sticky electrodes will be placed on your chest. The electrodes will be connected to an electrocardiogram (ECG) machine to record a tracing of the electrical activity of your heart. A CT scanner will take pictures of your heart. During this time, you will be asked to lie still and hold your breath for 2-3 seconds while a picture of your heart is being taken. The procedure may vary among health care providers and hospitals. What happens after the procedure? You can get dressed. You can return to your normal activities. It is up to you to get the results of your test. Ask your health care provider, or the department that is doing the test, when your results will be ready. Summary A coronary calcium scan is an imaging test used to look for deposits of calcium and other fatty materials (plaques) in the inner lining of the blood vessels of the heart (coronary arteries). Generally, this is a safe procedure. Tell your health care provider if you are pregnant or may be pregnant. No preparation is needed for this procedure. A CT scanner will take pictures of your  heart. You can return to your normal activities after the scan is done. This information is not intended to replace advice given to you by your health care provider. Make sure you discuss any questions you have with your health care provider. Document Released: 01/22/2008 Document Revised: 06/14/2016 Document Reviewed: 06/14/2016 Elsevier Interactive  Patient Education  2017 [place]: At [facility], you and your health needs are our priority.  As part of our continuing mission to provide you with exceptional heart care, we have created designated Provider Care Teams.  These Care Teams include your primary Cardiologist (physician) and Advanced Practice Providers (APPs -  Physician Assistants and Nurse Practitioners) who all work together to provide you with the care you need, when you need it.  We recommend signing up for the patient portal called "MyChart".  Sign up information is provided on this After Visit Summary.  MyChart is used to connect with patients for Virtual Visits (Telemedicine).  Patients are able to view lab/test results, encounter notes, upcoming appointments, etc.  Non-urgent messages can be sent to your provider as well.   To learn more about what you can do with MyChart, go to [email or web address].    Your next appointment:   12 month(s)  The format for your next appointment:   In Person  Provider:   Candee Furbish, MD     Other Instructions

## 2021-10-06 DIAGNOSIS — I4891 Unspecified atrial fibrillation: Secondary | ICD-10-CM | POA: Diagnosis not present

## 2021-10-06 DIAGNOSIS — Z125 Encounter for screening for malignant neoplasm of prostate: Secondary | ICD-10-CM | POA: Diagnosis not present

## 2021-10-06 DIAGNOSIS — I4892 Unspecified atrial flutter: Secondary | ICD-10-CM | POA: Diagnosis not present

## 2021-10-06 DIAGNOSIS — Z7982 Long term (current) use of aspirin: Secondary | ICD-10-CM | POA: Diagnosis not present

## 2021-10-06 DIAGNOSIS — E785 Hyperlipidemia, unspecified: Secondary | ICD-10-CM | POA: Diagnosis not present

## 2021-10-06 DIAGNOSIS — C4361 Malignant melanoma of right upper limb, including shoulder: Secondary | ICD-10-CM | POA: Diagnosis not present

## 2021-10-22 DIAGNOSIS — Z85820 Personal history of malignant melanoma of skin: Secondary | ICD-10-CM | POA: Diagnosis not present

## 2021-10-22 DIAGNOSIS — C4361 Malignant melanoma of right upper limb, including shoulder: Secondary | ICD-10-CM | POA: Diagnosis not present

## 2021-10-22 DIAGNOSIS — Z85828 Personal history of other malignant neoplasm of skin: Secondary | ICD-10-CM | POA: Diagnosis not present

## 2021-10-22 DIAGNOSIS — D0361 Melanoma in situ of right upper limb, including shoulder: Secondary | ICD-10-CM | POA: Diagnosis not present

## 2021-11-13 ENCOUNTER — Ambulatory Visit
Admission: RE | Admit: 2021-11-13 | Discharge: 2021-11-13 | Disposition: A | Discharge status: Home / Self Care | Payer: Self-pay | Source: Ambulatory Visit | Attending: Physician Assistant | Admitting: Physician Assistant

## 2021-11-13 ENCOUNTER — Other Ambulatory Visit: Payer: Medicare PPO | Admitting: *Deleted

## 2021-11-13 DIAGNOSIS — I480 Paroxysmal atrial fibrillation: Secondary | ICD-10-CM | POA: Diagnosis not present

## 2021-11-13 DIAGNOSIS — E7849 Other hyperlipidemia: Secondary | ICD-10-CM | POA: Diagnosis not present

## 2021-11-13 DIAGNOSIS — I6523 Occlusion and stenosis of bilateral carotid arteries: Secondary | ICD-10-CM

## 2021-11-13 DIAGNOSIS — Z8249 Family history of ischemic heart disease and other diseases of the circulatory system: Secondary | ICD-10-CM

## 2021-11-16 ENCOUNTER — Other Ambulatory Visit: Payer: Self-pay

## 2021-11-16 DIAGNOSIS — E7849 Other hyperlipidemia: Secondary | ICD-10-CM

## 2021-11-16 LAB — SPECIMEN STATUS REPORT

## 2021-11-17 ENCOUNTER — Other Ambulatory Visit: Payer: Self-pay

## 2021-11-17 DIAGNOSIS — Z8249 Family history of ischemic heart disease and other diseases of the circulatory system: Secondary | ICD-10-CM

## 2021-11-17 DIAGNOSIS — E7849 Other hyperlipidemia: Secondary | ICD-10-CM

## 2021-11-17 DIAGNOSIS — E7800 Pure hypercholesterolemia, unspecified: Secondary | ICD-10-CM

## 2021-11-18 ENCOUNTER — Other Ambulatory Visit: Payer: Medicare PPO | Admitting: *Deleted

## 2021-11-18 DIAGNOSIS — E7849 Other hyperlipidemia: Secondary | ICD-10-CM | POA: Diagnosis not present

## 2021-11-18 DIAGNOSIS — Z8249 Family history of ischemic heart disease and other diseases of the circulatory system: Secondary | ICD-10-CM | POA: Diagnosis not present

## 2021-11-18 DIAGNOSIS — I480 Paroxysmal atrial fibrillation: Secondary | ICD-10-CM | POA: Diagnosis not present

## 2021-11-18 DIAGNOSIS — I6523 Occlusion and stenosis of bilateral carotid arteries: Secondary | ICD-10-CM | POA: Diagnosis not present

## 2021-11-18 LAB — CBC
Hematocrit: 42.7 % (ref 37.5–51.0)
Hemoglobin: 14.6 g/dL (ref 13.0–17.7)
MCH: 29.8 pg (ref 26.6–33.0)
MCHC: 34.2 g/dL (ref 31.5–35.7)
MCV: 87 fL (ref 79–97)
Platelets: 151 10*3/uL (ref 150–450)
RBC: 4.9 x10E6/uL (ref 4.14–5.80)
RDW: 14 % (ref 11.6–15.4)
WBC: 7.6 10*3/uL (ref 3.4–10.8)

## 2021-11-30 ENCOUNTER — Telehealth (HOSPITAL_COMMUNITY): Payer: Self-pay | Admitting: Emergency Medicine

## 2021-11-30 NOTE — Telephone Encounter (Signed)
Reaching out to patient to offer assistance regarding upcoming cardiac imaging study; pt verbalizes understanding of appt date/time, parking situation and where to check in, pre-test NPO status and medications ordered, and verified current allergies; name and call back number provided for further questions should they arise Alfred Dubinsky RN Navigator Cardiac Imaging [facility] [phone] office [phone] cell 

## 2021-12-01 ENCOUNTER — Encounter (HOSPITAL_COMMUNITY)
Admission: RE | Admit: 2021-12-01 | Discharge: 2021-12-01 | Disposition: A | Discharge status: Home / Self Care | Payer: Medicare PPO | Source: Ambulatory Visit | Attending: Physician Assistant | Admitting: Physician Assistant

## 2021-12-01 DIAGNOSIS — Z8249 Family history of ischemic heart disease and other diseases of the circulatory system: Secondary | ICD-10-CM | POA: Diagnosis not present

## 2021-12-01 DIAGNOSIS — E7800 Pure hypercholesterolemia, unspecified: Secondary | ICD-10-CM | POA: Diagnosis not present

## 2021-12-01 DIAGNOSIS — R079 Chest pain, unspecified: Secondary | ICD-10-CM | POA: Insufficient documentation

## 2021-12-01 DIAGNOSIS — E7849 Other hyperlipidemia: Secondary | ICD-10-CM | POA: Insufficient documentation

## 2021-12-01 LAB — NM PET CT CARDIAC PERFUSION MULTI W/ABSOLUTE BLOODFLOW
Base ST Depression (mm): 0 mm
LV dias vol: 130 mL (ref 62–150)
LV sys vol: 47 mL
MBFR: 3.3
Nuc Rest EF: 66 %
Nuc Stress EF: 67 %
Peak HR: 86 {beats}/min
Rest HR: 64 {beats}/min
Rest MBF: 0.7 ml/g/min
Rest Nuclear Isotope Dose: 25.3 mCi
ST Depression (mm): 0 mm
Stress MBF: 2.3 ml/g/min
Stress Nuclear Isotope Dose: 25.3 mCi
TID: 0.85

## 2021-12-01 MED ORDER — RUBIDIUM RB82 GENERATOR (RUBYFILL)
856.0000 | PACK | Freq: Once | INTRAVENOUS | Status: DC
Start: 2021-12-01 — End: 2021-12-01

## 2021-12-01 MED ORDER — RUBIDIUM RB82 GENERATOR (RUBYFILL)
25.2500 | PACK | Freq: Once | INTRAVENOUS | Status: AC
  Administered 2021-12-01: 25.25 via INTRAVENOUS

## 2021-12-01 MED ORDER — REGADENOSON 0.4 MG/5ML IV SOLN
INTRAVENOUS | Status: AC
  Filled 2021-12-01: qty 5

## 2021-12-15 LAB — COMPREHENSIVE METABOLIC PANEL
ALT: 13 IU/L (ref 0–44)
AST: 20 IU/L (ref 0–40)
Albumin/Globulin Ratio: 1.5 (ref 1.2–2.2)
Albumin: 4.6 g/dL (ref 3.7–4.7)
Alkaline Phosphatase: 76 IU/L (ref 44–121)
BUN/Creatinine Ratio: 11 (ref 10–24)
BUN: 11 mg/dL (ref 8–27)
Bilirubin Total: 0.6 mg/dL (ref 0.0–1.2)
CO2: 25 mmol/L (ref 20–29)
Calcium: 9 mg/dL (ref 8.6–10.2)
Chloride: 104 mmol/L (ref 96–106)
Creatinine, Ser: 1.03 mg/dL (ref 0.76–1.27)
Globulin, Total: 3 g/dL (ref 1.5–4.5)
Glucose: 114 mg/dL — ABNORMAL HIGH (ref 70–99)
Potassium: 4.7 mmol/L (ref 3.5–5.2)
Sodium: 144 mmol/L (ref 134–144)
Total Protein: 7.6 g/dL (ref 6.0–8.5)
eGFR: 77 mL/min/{1.73_m2} (ref >59)

## 2021-12-15 LAB — CBC

## 2021-12-15 LAB — LIPID PANEL
Chol/HDL Ratio: 8 ratio — ABNORMAL HIGH (ref 0.0–5.0)
Cholesterol, Total: 215 mg/dL — ABNORMAL HIGH (ref 100–199)
HDL: 27 mg/dL — ABNORMAL LOW (ref >39)
LDL Chol Calc (NIH): 158 mg/dL — ABNORMAL HIGH (ref 0–99)
Triglycerides: 162 mg/dL — ABNORMAL HIGH (ref 0–149)
VLDL Cholesterol Cal: 30 mg/dL (ref 5–40)

## 2021-12-15 LAB — TSH: TSH: 6.07 u[IU]/mL — ABNORMAL HIGH (ref 0.450–4.500)

## 2021-12-15 LAB — LIPOPROTEIN A (LPA): Lipoprotein (a): 169.3 nmol/L — ABNORMAL HIGH (ref <75.0)

## 2021-12-22 NOTE — Progress Notes (Signed)
Patient ID: Alfred Thomas.                 DOB: [birth year]                    MRN: [id number] ? ? ? ? ?HPI: ?Rohen Kimes. is a 71 y.o. male patient of Dr. [name] referred to lipid clinic by PA Ermalinda Barrios. PMH is significant for Afib (2017) in setting of GI virus-not anticoagulated, HLD, carotid atherosclerosis (carotid US 2019), severe coronary calcifications in LAD, LCA, and RCA (PET CT, CAC score 2541 in Apr 2023- 94th percentile), prediabetes (A1c 6.3% in 2018). Last seen in Feb 2023 by Ermalinda Barrios, and no medication changes were made.  ? ?Patient presents today for lipid management. Pt is only taking pravastatin 80 mg daily. We had '40mg'$  listed on our med list, sounds like prior PCP increased his dose last year which he's been tolerating well. Does not remember ever taking ezetimibe 10 mg daily, and is no longer taking coenzyme Q10. He denies any muscle aches/pains on pravastatin. Concerned about LFT elevation given previous asymptomatic elevation on Crestor, but otherwise willing to continue pravastatin 80 mg. PCP Dr. Felipa Eth is retiring, so pt is transferring care to Dr. Caryl Comes at the [facility] in [place] (recently moved from [place] to [place]). He has his first appt in 2 weeks (having baseline labs drawn in ~1 week). Requests care coordination and print outs of recent labs from 11/13/21.  ? ?Current Medications: pravastatin 80 mg daily ?Intolerances: atorvastatin (severe muscle aches), rosuvastatin (LFT elevation) ?Risk Factors: ASCVD per imaging and CAC score, elevated Lp(a) ?LDL goal: <70 mg/dL ? ?Exercise: Walks dog 20-30 min daily, goes to [facility] 3 days/week to use weights.  ? ?Family History: CABG in brother, CAD/MI in mother, heart disease in father ? ?Social History: Alcohol 2-3x/week. Denies tobacco or drug use.  ? ?Insurance: Psychologist, prison and probation services through [employer] ? ?Labs: ?11/13/21: TC 215, TG 162, HDL 27, VLDL 30, LDL-C 158, Chol/HDL 8.0 (on pravastatin 40  mg) ?11/13/21: Lp(a) 169.3 nmol/L ?11/13/21: LFTs wnl ? ?Past Medical History:  ?Diagnosis Date  ? Atrial fibrillation ([place])   ? Cancer [facility])   ? melanoma skin cancer shoulder  ? Diverticulitis   ? H/O echocardiogram   ? Hyperglycemia   ? Hyperlipidemia   ? Palpitations   ? ? ?Current Outpatient Medications on File Prior to Visit  ?Medication Sig Dispense Refill  ? aspirin EC 81 MG tablet Take 81 mg by mouth daily.    ? Coenzyme Q10 (CO Q 10 PO) Take by mouth daily.    ? diltiazem (CARDIZEM CD) 180 MG 24 hr capsule TAKE 1 CAPSULE BY MOUTH EVERY DAY 90 capsule 0  ? ezetimibe (ZETIA) 10 MG tablet Take 1 tablet (10 mg total) by mouth daily. 90 tablet 3  ? loratadine (CLARITIN) 10 MG tablet Take 10 mg by mouth daily.    ? pravastatin (PRAVACHOL) 40 MG tablet Take 40 mg by mouth daily.    ? ?Current Facility-Administered Medications on File Prior to Visit  ?Medication Dose Route Frequency Provider Last Rate Last Admin  ? 0.9 %  sodium chloride infusion  500 mL Intravenous Once Irene Shipper, MD      ? ? ?Allergies  ?Allergen Reactions  ? Crestor [Rosuvastatin] Other (See Comments)  ?  Abn. Labs   ? Lipitor [Atorvastatin] Other (See Comments)  ?  Severe muscle aches ?  ? ? ?Assessment/Plan: ? ?  1. Hyperlipidemia - LDL-C is uncontrolled at 158 mg/dL above goal <70 mg/dL given clinical ASCVD with carotid atherosclerosis and elevated CAC. Pt is currently on maximum tolerated dose of statin, pravastatin 80 mg daily, and requires >50% LDL-C lowering to achieve goal. Pt was prescribed ezetimibe in 2021 per chart review, though does not remember taking. Updated med list today. Although ezetimibe would be appropriate, expected ~20% LDL lowering would not achieve goal. Therefore, pt is a good candidate for a PCSK9i. Reviewed administration technique and potential AE with PCSK9i. Pt planning to discuss addition of PCSK9i with new PCP in ~2 weeks. Will submit PA to determine insurance coverage and communicate expected cost of medication  to pt. Provided print out of recent labs as requested.  ? ?Patient seen with Park Liter, PY4 PharmD Candidate ? ?Megan E. Supple, PharmD, BCACP, CPP ?[address], [place], [place] [address] ?Phone: [phone]; Fax: [phone] ?12/23/2021 10:40 AM ? ? ? ?

## 2021-12-23 ENCOUNTER — Telehealth: Payer: Self-pay

## 2021-12-23 ENCOUNTER — Ambulatory Visit: Payer: Medicare PPO | Admitting: Pharmacist

## 2021-12-23 DIAGNOSIS — E7841 Elevated Lipoprotein(a): Secondary | ICD-10-CM | POA: Diagnosis not present

## 2021-12-23 DIAGNOSIS — E7800 Pure hypercholesterolemia, unspecified: Secondary | ICD-10-CM

## 2021-12-23 DIAGNOSIS — R931 Abnormal findings on diagnostic imaging of heart and coronary circulation: Secondary | ICD-10-CM

## 2021-12-23 MED ORDER — REPATHA SURECLICK 140 MG/ML ~~LOC~~ SOAJ: 1.0000 "pen " | SUBCUTANEOUS | 3 refills | Status: DC

## 2021-12-23 NOTE — Patient Instructions (Addendum)
Your LDL cholesterol is 158 and your goal is < 70 ? ?I will submit information to your insurance for Repatha and let you know when I hear back.  ?  ?Repatha is a subcutaneous injection given once every 2 weeks in the fatty tissue of your stomach or upper outer thigh. Store the medication in the fridge. You can let your dose warm up to room temperature for 30 minutes before injecting if you prefer. Repatha will lower your LDL cholesterol by 60% and helps to lower your chance of having a heart attack or stroke. ? ?You had your cholesterol, CBC, CMET, TSH all checked on April 7th ? ? ?

## 2021-12-23 NOTE — Telephone Encounter (Signed)
PA approved for Repatha through 08/08/22. Sent Rx to [facility] in [place]. Pharmacy reported copay of $80/3 mo supply. Left message for pt advising of approval. Pt wanted to confirm starting Repatha with his PCP in ~2 weeks, so pt will need to notify pharmacy if he is not picking up the prescription this week. Will need to schedule follow-up labs in early August, 2-3 mo after starting Repatha.

## 2021-12-28 NOTE — Telephone Encounter (Signed)
Attempted to contact pt via telephone to follow-up on Repatha approval from last week. Left VM for pt letting him know that Rx for Repatha was ready at his preferred pharmacy. Provided call back #. Will f/u at the end of May expecting that pt will have discussed the new medication with his PCP by that time.

## 2021-12-30 DIAGNOSIS — I4892 Unspecified atrial flutter: Secondary | ICD-10-CM | POA: Diagnosis not present

## 2021-12-30 DIAGNOSIS — Z7982 Long term (current) use of aspirin: Secondary | ICD-10-CM | POA: Diagnosis not present

## 2021-12-30 DIAGNOSIS — Z125 Encounter for screening for malignant neoplasm of prostate: Secondary | ICD-10-CM | POA: Diagnosis not present

## 2021-12-30 DIAGNOSIS — I4891 Unspecified atrial fibrillation: Secondary | ICD-10-CM | POA: Diagnosis not present

## 2021-12-30 DIAGNOSIS — E785 Hyperlipidemia, unspecified: Secondary | ICD-10-CM | POA: Diagnosis not present

## 2022-01-06 DIAGNOSIS — I4891 Unspecified atrial fibrillation: Secondary | ICD-10-CM | POA: Diagnosis not present

## 2022-01-06 DIAGNOSIS — Z0000 Encounter for general adult medical examination without abnormal findings: Secondary | ICD-10-CM | POA: Diagnosis not present

## 2022-01-06 DIAGNOSIS — Z135 Encounter for screening for eye and ear disorders: Secondary | ICD-10-CM | POA: Diagnosis not present

## 2022-01-06 DIAGNOSIS — B351 Tinea unguium: Secondary | ICD-10-CM | POA: Diagnosis not present

## 2022-01-06 DIAGNOSIS — R7309 Other abnormal glucose: Secondary | ICD-10-CM | POA: Diagnosis not present

## 2022-01-06 DIAGNOSIS — I2510 Atherosclerotic heart disease of native coronary artery without angina pectoris: Secondary | ICD-10-CM | POA: Diagnosis not present

## 2022-01-06 DIAGNOSIS — Z85820 Personal history of malignant melanoma of skin: Secondary | ICD-10-CM | POA: Diagnosis not present

## 2022-01-06 DIAGNOSIS — I2584 Coronary atherosclerosis due to calcified coronary lesion: Secondary | ICD-10-CM | POA: Diagnosis not present

## 2022-01-06 DIAGNOSIS — E785 Hyperlipidemia, unspecified: Secondary | ICD-10-CM | POA: Diagnosis not present

## 2022-01-06 NOTE — Telephone Encounter (Signed)
3rd voicemail left for pt. Will await his return call at this time.

## 2022-01-23 ENCOUNTER — Other Ambulatory Visit: Payer: Self-pay | Admitting: Cardiology

## 2022-01-27 DIAGNOSIS — L600 Ingrowing nail: Secondary | ICD-10-CM | POA: Diagnosis not present

## 2022-01-27 DIAGNOSIS — M79675 Pain in left toe(s): Secondary | ICD-10-CM | POA: Diagnosis not present

## 2022-01-27 DIAGNOSIS — B351 Tinea unguium: Secondary | ICD-10-CM | POA: Diagnosis not present

## 2022-01-27 DIAGNOSIS — M79674 Pain in right toe(s): Secondary | ICD-10-CM | POA: Diagnosis not present

## 2022-01-27 DIAGNOSIS — R234 Changes in skin texture: Secondary | ICD-10-CM | POA: Diagnosis not present

## 2022-03-10 DIAGNOSIS — H52223 Regular astigmatism, bilateral: Secondary | ICD-10-CM | POA: Diagnosis not present

## 2022-03-10 DIAGNOSIS — D485 Neoplasm of uncertain behavior of skin: Secondary | ICD-10-CM | POA: Diagnosis not present

## 2022-03-10 DIAGNOSIS — L821 Other seborrheic keratosis: Secondary | ICD-10-CM | POA: Diagnosis not present

## 2022-03-10 DIAGNOSIS — L570 Actinic keratosis: Secondary | ICD-10-CM | POA: Diagnosis not present

## 2022-03-10 DIAGNOSIS — D225 Melanocytic nevi of trunk: Secondary | ICD-10-CM | POA: Diagnosis not present

## 2022-03-10 DIAGNOSIS — H2513 Age-related nuclear cataract, bilateral: Secondary | ICD-10-CM | POA: Diagnosis not present

## 2022-03-10 DIAGNOSIS — Z85820 Personal history of malignant melanoma of skin: Secondary | ICD-10-CM | POA: Diagnosis not present

## 2022-03-10 DIAGNOSIS — Z85828 Personal history of other malignant neoplasm of skin: Secondary | ICD-10-CM | POA: Diagnosis not present

## 2022-05-12 DIAGNOSIS — S92512A Displaced fracture of proximal phalanx of left lesser toe(s), initial encounter for closed fracture: Secondary | ICD-10-CM | POA: Diagnosis not present

## 2022-05-12 DIAGNOSIS — S99922A Unspecified injury of left foot, initial encounter: Secondary | ICD-10-CM | POA: Diagnosis not present

## 2022-06-02 DIAGNOSIS — S92512A Displaced fracture of proximal phalanx of left lesser toe(s), initial encounter for closed fracture: Secondary | ICD-10-CM | POA: Diagnosis not present

## 2022-06-02 DIAGNOSIS — S99922A Unspecified injury of left foot, initial encounter: Secondary | ICD-10-CM | POA: Diagnosis not present

## 2022-06-07 DIAGNOSIS — Z85820 Personal history of malignant melanoma of skin: Secondary | ICD-10-CM | POA: Diagnosis not present

## 2022-06-07 DIAGNOSIS — L570 Actinic keratosis: Secondary | ICD-10-CM | POA: Diagnosis not present

## 2022-06-07 DIAGNOSIS — C44722 Squamous cell carcinoma of skin of right lower limb, including hip: Secondary | ICD-10-CM | POA: Diagnosis not present

## 2022-06-07 DIAGNOSIS — D485 Neoplasm of uncertain behavior of skin: Secondary | ICD-10-CM | POA: Diagnosis not present

## 2022-06-07 DIAGNOSIS — Z85828 Personal history of other malignant neoplasm of skin: Secondary | ICD-10-CM | POA: Diagnosis not present

## 2022-07-07 DIAGNOSIS — I4892 Unspecified atrial flutter: Secondary | ICD-10-CM | POA: Diagnosis not present

## 2022-07-07 DIAGNOSIS — E785 Hyperlipidemia, unspecified: Secondary | ICD-10-CM | POA: Diagnosis not present

## 2022-07-07 DIAGNOSIS — I4891 Unspecified atrial fibrillation: Secondary | ICD-10-CM | POA: Diagnosis not present

## 2022-07-14 DIAGNOSIS — H9190 Unspecified hearing loss, unspecified ear: Secondary | ICD-10-CM | POA: Diagnosis not present

## 2022-07-14 DIAGNOSIS — I480 Paroxysmal atrial fibrillation: Secondary | ICD-10-CM | POA: Diagnosis not present

## 2022-07-14 DIAGNOSIS — C439 Malignant melanoma of skin, unspecified: Secondary | ICD-10-CM | POA: Diagnosis not present

## 2022-07-14 DIAGNOSIS — E785 Hyperlipidemia, unspecified: Secondary | ICD-10-CM | POA: Diagnosis not present

## 2022-07-14 DIAGNOSIS — I2584 Coronary atherosclerosis due to calcified coronary lesion: Secondary | ICD-10-CM | POA: Diagnosis not present

## 2022-07-14 DIAGNOSIS — I2510 Atherosclerotic heart disease of native coronary artery without angina pectoris: Secondary | ICD-10-CM | POA: Diagnosis not present

## 2022-07-14 DIAGNOSIS — H6123 Impacted cerumen, bilateral: Secondary | ICD-10-CM | POA: Diagnosis not present

## 2022-07-14 DIAGNOSIS — C44722 Squamous cell carcinoma of skin of right lower limb, including hip: Secondary | ICD-10-CM | POA: Diagnosis not present

## 2022-07-14 DIAGNOSIS — Z85828 Personal history of other malignant neoplasm of skin: Secondary | ICD-10-CM | POA: Diagnosis not present

## 2022-07-14 DIAGNOSIS — Z85820 Personal history of malignant melanoma of skin: Secondary | ICD-10-CM | POA: Diagnosis not present

## 2022-08-16 ENCOUNTER — Telehealth: Payer: Self-pay

## 2022-08-16 ENCOUNTER — Other Ambulatory Visit (HOSPITAL_COMMUNITY): Payer: Self-pay

## 2022-08-16 NOTE — Telephone Encounter (Signed)
Pharmacy Patient Advocate Encounter  Prior Authorization for REPATHA 140 MG/ML INJ has been approved.    Effective dates: 08/09/22 through 08/09/23  Kathan Kirker, CPhT Pharmacy Patient Advocate Specialist Direct Number: [phone] Fax: [phone] 

## 2022-09-21 NOTE — Progress Notes (Signed)
Cardiology Office Note:    Date:  09/28/2022   ID:  Rommell Mccumbers., DOB [birth year], MRN [id number]  PCP:  Adin Hector, MD  [facility] Cardiologist:  Candee Furbish, MD     Referring MD: Adin Hector, MD   Chief Complaint:  No chief complaint on file.     History of Present Illness:   Elius Chipley. is a 72 y.o. male with history of PAF 2017 in setting of GI virus-not anticoagulated because of single occurrence, HLD statin intolerant, carotid atherosclerosis 2019.   I saw patient 09/2021 and his younger brother had just had CABG.  He was not tolerating higher dose statins.  Coronary calcium score over 2500 so PET scan ordered which showed low risk no ischemia but severe coronary calcification noted in the LAD circumflex and RCA.  He was referred to lipid clinic.  Patient comes in for f/u. Patient is now on Repatha and labs by PCP are much better. Patient moved to [facility] and would like to switch cardiologist there. No Chest pain, dyspnea, dizziness, edema.         Past Medical History:  Diagnosis Date   Atrial fibrillation ([place])    Cancer ([place])    melanoma skin cancer shoulder   Diverticulitis    H/O echocardiogram    Hyperglycemia    Hyperlipidemia    Palpitations    Current Medications: Current Meds  Medication Sig   aspirin EC 81 MG tablet Take 81 mg by mouth daily.   diltiazem (CARDIZEM CD) 180 MG 24 hr capsule TAKE 1 CAPSULE BY MOUTH EVERY DAY   Evolocumab (REPATHA SURECLICK) [id number] MG/ML SOAJ Inject 1 pen. into the skin every 14 (fourteen) days.   Current Facility-Administered Medications for the 09/28/22 encounter (Office Visit) with Imogene Burn, PA-C  Medication   0.9 %  sodium chloride infusion    Allergies:   Crestor [rosuvastatin] and Lipitor [atorvastatin]   Social History   Tobacco Use   Smoking status: Never   Smokeless tobacco: Never  Vaping Use   Vaping Use: Never used  Substance Use Topics   Alcohol  use: Yes    Alcohol/week: 0.0 standard drinks of alcohol    Comment: socially   Drug use: No    Family Hx: The patient's family history includes Coronary artery disease in his mother; Emphysema in his father; Heart attack in his mother; Heart disease in his father and mother. There is no history of Colon cancer, Colon polyps, Esophageal cancer, Rectal cancer, or Stomach cancer.  ROS     Physical Exam:    VS:  BP 128/68   Pulse 65     Wt Readings from Last 3 Encounters:  09/23/21 215 lb (97.5 kg)  07/16/20 216 lb (98 kg)  12/19/19 212 lb 11.2 oz (96.5 kg)    Physical Exam  GEN: Well nourished, well developed, in no acute distress  Neck: no JVD, carotid bruits, or masses Cardiac:RRR; no murmurs, rubs, or gallops  Respiratory:  clear to auscultation bilaterally, normal work of breathing GI: soft, nontender, nondistended, + BS Ext: without cyanosis, clubbing, or edema, Good distal  Neuro:  Alert and Oriented x 3,  Psych: euthymic mood, full affect        EKGs/Labs/Other Test Reviewed:    EKG:  EKG is  not ordered today.     Recent Labs: 11/13/2021: ALT 13; BUN 11; Creatinine, Ser 1.03; Potassium 4.7; Sodium 144; TSH 6.070 11/18/2021:  Hemoglobin 14.6; Platelets 151   Recent Lipid Panel Recent Labs    11/13/21 0752  CHOL 215*  TRIG 162*  HDL 27*  LDLCALC 158*     Prior CV Studies:   NM PET cardiac perfusion 12/31/21   LV perfusion is normal. There is no evidence of ischemia. There is no evidence of infarction.   Rest left ventricular function is normal. Rest EF: 66 %. Stress left ventricular function is normal. Stress EF: 67 %. End diastolic cavity size is normal. End systolic cavity size is normal.   Myocardial blood flow was computed to be 0.66m/g/min at rest and 2.338mg/min at stress. Global myocardial blood flow reserve was 3.20 and was normal.   Coronary calcium was present on the attenuation correction CT images. Severe coronary calcifications were present.  Coronary calcifications were present in the left anterior descending artery, left circumflex artery and right coronary artery distribution(s).   The study is normal. The study is low risk.   Self pay coronary calcium score 11/13/21 IMPRESSION: Coronary calcium score of 2541. This was 94th percentile for age-, race-, and sex-matched controls.    Risk Assessment/Calculations/Metrics:              ASSESSMENT & PLAN:   No problem-specific Assessment & Plan notes found for this encounter.   CAD calcium score 2541 coronary PET scan low risk no ischemia but severe coronary calcification noted in the LAD circumflex and RCA now on Repatha but has doubled in cost.-will check with pharmacy  and requests labs from PCP  PAF in the setting of stomach virus 2017 no recurrence on diltiazem.  CHA2DS2-VASc equals 2 so he will need anticoagulation if recurrence  Carotid atherosclerosis mild in 2019  Family history of CAD younger brother had CABG            Dispo:  No follow-ups on file.   Medication Adjustments/Labs and Tests Ordered: Current medicines are reviewed at length with the patient today.  Concerns regarding medicines are outlined above.  Tests Ordered: No orders of the defined types were placed in this encounter.  Medication Changes: No orders of the defined types were placed in this encounter.  Signed, MiErmalinda BarriosPA-C  09/28/2022 2:15 PM    [email or web address]  [id number]: [phone]: [phone]

## 2022-09-28 ENCOUNTER — Telehealth: Payer: Self-pay

## 2022-09-28 ENCOUNTER — Ambulatory Visit: Payer: Medicare HMO | Attending: Physician Assistant | Admitting: Physician Assistant

## 2022-09-28 ENCOUNTER — Other Ambulatory Visit (HOSPITAL_COMMUNITY): Payer: Self-pay

## 2022-09-28 ENCOUNTER — Telehealth: Payer: Self-pay | Admitting: Pharmacist

## 2022-09-28 ENCOUNTER — Encounter: Payer: Self-pay | Admitting: Physician Assistant

## 2022-09-28 VITALS — BP 128/68 | HR 65 | Ht 76.8 in | Wt 210.0 lb

## 2022-09-28 DIAGNOSIS — Z8249 Family history of ischemic heart disease and other diseases of the circulatory system: Secondary | ICD-10-CM

## 2022-09-28 DIAGNOSIS — R931 Abnormal findings on diagnostic imaging of heart and coronary circulation: Secondary | ICD-10-CM

## 2022-09-28 DIAGNOSIS — I480 Paroxysmal atrial fibrillation: Secondary | ICD-10-CM

## 2022-09-28 DIAGNOSIS — I6523 Occlusion and stenosis of bilateral carotid arteries: Secondary | ICD-10-CM | POA: Diagnosis not present

## 2022-09-28 NOTE — Telephone Encounter (Signed)
Key: [id number]

## 2022-09-28 NOTE — Telephone Encounter (Signed)
Patient switched from [facility] state health plan to [employer] and [employer] state health. In apt with Selinda Eon he stated cost went from $80 to $141. I explained to him that it the copay for [employer]. Stated he could call [employer] to see if he was eligible for copay card to use with commercial state health, but might not be.  He said he was ok with the copay.

## 2022-09-28 NOTE — Patient Instructions (Signed)
Medication Instructions:  Your physician recommends that you continue on your current medications as directed. Please refer to the Current Medication list given to you today.  *If you need a refill on your cardiac medications before your next appointment, please call your pharmacy*   Lab Work: NONE If you have labs (blood work) drawn today and your tests are completely normal, you will receive your results only by: [place] (if you have MyChart) OR A paper copy in the mail If you have any lab test that is abnormal or we need to change your treatment, we will call you to review the results.   Testing/Procedures: NONE   Follow-Up: At [facility], you and your health needs are our priority.  As part of our continuing mission to provide you with exceptional heart care, we have created designated Provider Care Teams.  These Care Teams include your primary Cardiologist (physician) and Advanced Practice Providers (APPs -  Physician Assistants and Nurse Practitioners) who all work together to provide you with the care you need, when you need it.  We recommend signing up for the patient portal called "MyChart".  Sign up information is provided on this After Visit Summary.  MyChart is used to connect with patients for Virtual Visits (Telemedicine).  Patients are able to view lab/test results, encounter notes, upcoming appointments, etc.  Non-urgent messages can be sent to your provider as well.   To learn more about what you can do with MyChart, go to [email or web address].    Your next appointment:   1 year(s)  Provider:   You may see DR. END or one of the following Advanced Practice Providers on your designated Care Team:   Murray Hodgkins, NP Christell Faith, PA-C Cadence Kathlen Mody, PA-C Gerrie Nordmann, NP {

## 2022-10-20 ENCOUNTER — Other Ambulatory Visit: Payer: Self-pay | Admitting: Cardiology

## 2022-12-06 ENCOUNTER — Encounter: Payer: Self-pay | Admitting: Internal Medicine

## 2022-12-25 ENCOUNTER — Other Ambulatory Visit: Payer: Self-pay | Admitting: Cardiology

## 2022-12-25 DIAGNOSIS — E7800 Pure hypercholesterolemia, unspecified: Secondary | ICD-10-CM

## 2022-12-25 DIAGNOSIS — R931 Abnormal findings on diagnostic imaging of heart and coronary circulation: Secondary | ICD-10-CM

## 2022-12-25 DIAGNOSIS — I6523 Occlusion and stenosis of bilateral carotid arteries: Secondary | ICD-10-CM

## 2023-01-18 ENCOUNTER — Encounter: Payer: Self-pay | Admitting: Internal Medicine

## 2023-02-02 ENCOUNTER — Telehealth: Payer: Self-pay

## 2023-02-02 NOTE — Telephone Encounter (Signed)
Multiple attempts made to reach patient;  Unable to speak with patient; Unable to leave a voicemail on home or cell phone numbers;  Will attempt to call patient by the end of day to see if patient is able to reschedule PV appt; If unable to reach patient by end of business day, a no show letter will be sent to the patient; If no show letter is sent to the patient, the PV and procedure appts will be cancelled;

## 2023-02-14 ENCOUNTER — Ambulatory Visit (AMBULATORY_SURGERY_CENTER): Payer: Medicare HMO

## 2023-02-14 VITALS — Ht 76.0 in | Wt 210.0 lb

## 2023-02-14 DIAGNOSIS — Z86010 Personal history of colonic polyps: Secondary | ICD-10-CM

## 2023-02-14 MED ORDER — NA SULFATE-K SULFATE-MG SULF 17.5-3.13-1.6 GM/177ML PO SOLN: 1.0000 | Freq: Once | ORAL | 0 refills | Status: AC

## 2023-02-14 NOTE — Progress Notes (Unsigned)
No egg or soy allergy known to patient  No issues known to pt with past sedation with any surgeries or procedures Patient denies ever being told they had issues or difficulty with intubation  No FH of Malignant Hyperthermia Pt is not on diet pills Pt is not on  home 02  Pt is not on blood thinners  Pt denies issues with constipation  Positive history of A fib  Have any cardiac testing pending--no Patient's chart reviewed by Cathlyn Parsons CNRA prior to previsit and patient appropriate for the [facility].  Previsit completed and red dot placed by patient's name on their procedure day (on provider's schedule).    Ambulates independently Pt instructed to use [email or web address].com or GoodRx for a price reduction on prep

## 2023-03-01 ENCOUNTER — Encounter: Payer: Self-pay | Admitting: Internal Medicine

## 2023-03-01 ENCOUNTER — Ambulatory Visit (AMBULATORY_SURGERY_CENTER): Payer: Medicare HMO | Admitting: Internal Medicine

## 2023-03-01 VITALS — BP 124/90 | HR 57 | Temp 98.4°F | Resp 12

## 2023-03-01 DIAGNOSIS — Z09 Encounter for follow-up examination after completed treatment for conditions other than malignant neoplasm: Secondary | ICD-10-CM

## 2023-03-01 DIAGNOSIS — K5140 Inflammatory polyps of colon without complications: Secondary | ICD-10-CM | POA: Diagnosis not present

## 2023-03-01 DIAGNOSIS — Z86010 Personal history of colonic polyps: Secondary | ICD-10-CM

## 2023-03-01 DIAGNOSIS — D123 Benign neoplasm of transverse colon: Secondary | ICD-10-CM | POA: Diagnosis not present

## 2023-03-01 DIAGNOSIS — D122 Benign neoplasm of ascending colon: Secondary | ICD-10-CM | POA: Diagnosis not present

## 2023-03-01 HISTORY — PX: COLONOSCOPY W/ POLYPECTOMY: SHX1380

## 2023-03-01 MED ORDER — SODIUM CHLORIDE 0.9 % IV SOLN: 500.0000 mL | Freq: Once | INTRAVENOUS | Status: DC

## 2023-03-01 NOTE — Progress Notes (Signed)
Called to room to assist during endoscopic procedure.  Patient ID and intended procedure confirmed with present staff. Received instructions for my participation in the procedure from the performing physician.  

## 2023-03-01 NOTE — Patient Instructions (Signed)

## 2023-03-01 NOTE — Progress Notes (Signed)
A and O x3. Report to RN. Tolerated MAC anesthesia well.

## 2023-03-01 NOTE — Op Note (Signed)
[facility] Patient Name: Alfred Thomas Procedure Date: 03/01/2023 9:03 AM MRN: [id number] Endoscopist: Wilhemina Bonito. Marina Goodell , MD, [id number] Age: 72 Referring MD:  Date of Birth: 06-10-1951 Gender: Male Account #: [id number] Procedure:                Colonoscopy with cold snare polypectomy x 1; with                            biopsy polypectomy x 1 Indications:              High risk colon cancer surveillance: Personal                            history of multiple (3 or more) adenomas. Previous                            examinations 2004, 2009, 2016, 2021 Medicines:                Monitored Anesthesia Care Procedure:                Pre-Anesthesia Assessment:                           - Prior to the procedure, a History and Physical                            was performed, and patient medications and                            allergies were reviewed. The patient's tolerance of                            previous anesthesia was also reviewed. The risks                            and benefits of the procedure and the sedation                            options and risks were discussed with the patient.                            All questions were answered, and informed consent                            was obtained. Prior Anticoagulants: The patient has                            taken no anticoagulant or antiplatelet agents. ASA                            Grade Assessment: II - A patient with mild systemic                            disease. After reviewing the risks and benefits,  the patient was deemed in satisfactory condition to                            undergo the procedure.                           After obtaining informed consent, the colonoscope                            was passed under direct vision. Throughout the                            procedure, the patient's blood pressure, pulse, and                            oxygen  saturations were monitored continuously. The                            Olympus CF-[id number] ([id number]) Colonoscope was                            introduced through the anus and advanced to the the                            cecum, identified by appendiceal orifice and                            ileocecal valve. The ileocecal valve, appendiceal                            orifice, and rectum were photographed. The quality                            of the bowel preparation was excellent. The                            colonoscopy was performed without difficulty. The                            patient tolerated the procedure well. The bowel                            preparation used was SUPREP via split dose                            instruction. Scope In: 9:19:42 AM Scope Out: 9:40:02 AM Scope Withdrawal Time: 0 hours 15 minutes 56 seconds  Total Procedure Duration: 0 hours 20 minutes 20 seconds  Findings:                 A 3 mm polyp was found in the ascending colon. The                            polyp was removed with a cold snare. Resection and  retrieval were complete.                           A less than 1 mm polyp was found in the transverse                            colon. The polyp was removed with a jumbo cold                            forceps. Resection and retrieval were complete.                           Multiple diverticula were found in the entire colon.                           Internal hemorrhoids were found during retroflexion.                           The exam was otherwise without abnormality on                            direct and retroflexion views. Complications:            No immediate complications. Estimated blood loss:                            None. Estimated Blood Loss:     Estimated blood loss: none. Impression:               - One 3 mm polyp in the ascending colon, removed                            with a cold snare.  Resected and retrieved.                           - One less than 1 mm polyp in the transverse colon,                            removed with a jumbo cold forceps. Resected and                            retrieved.                           - Diverticulosis in the entire examined colon.                           - Internal hemorrhoids.                           - The examination was otherwise normal on direct                            and retroflexion views. Recommendation:           - Repeat colonoscopy in 5 years for  surveillance.                           - Patient has a contact number available for                            emergencies. The signs and symptoms of potential                            delayed complications were discussed with the                            patient. Return to normal activities tomorrow.                            Written discharge instructions were provided to the                            patient.                           - Resume previous diet.                           - Continue present medications.                           - Await pathology results. Wilhemina Bonito. Marina Goodell, MD 03/01/2023 9:46:33 AM This report has been signed electronically.

## 2023-03-01 NOTE — Progress Notes (Signed)
Pt's states no medical or surgical changes since previsit or office visit. 

## 2023-03-01 NOTE — Progress Notes (Signed)
HISTORY OF PRESENT ILLNESS:  Alfred Thomas. is a 72 y.o. male with a history of multiple adenomatous colon polyps.  Presents today for surveillance colonoscopy  REVIEW OF SYSTEMS:  All non-GI ROS negative except for  Past Medical History:  Diagnosis Date   Allergy    Atrial fibrillation (HCC)    Cancer (HCC)    melanoma skin cancer shoulder   Diabetes mellitus without complication (HCC)    Diverticulitis    H/O echocardiogram    Hyperglycemia    Hyperlipidemia    Palpitations     Past Surgical History:  Procedure Laterality Date   COLONOSCOPY     x2; last 2016   POLYPECTOMY     TONSILLECTOMY     WISDOM TOOTH EXTRACTION      Social History Alfred Thomas.  reports that he has never smoked. He has never used smokeless tobacco. He reports current alcohol use. He reports that he does not use drugs.  family history includes Coronary artery disease in his mother; Emphysema in his father; Heart attack in his mother; Heart disease in his father and mother.  Allergies  Allergen Reactions   Rosuvastatin Other (See Comments)    Abn. Labs  Other Reaction(s): Elev. LFT'S   Atorvastatin Other (See Comments)    Severe muscle aches  Other Reaction(s): Joint pain       PHYSICAL EXAMINATION: Vital signs: Temp 98.4 F (36.9 C)  General: Well-developed, well-nourished, no acute distress HEENT: Sclerae are anicteric, conjunctiva pink. Oral mucosa intact Lungs: Clear Heart: Regular Abdomen: soft, nontender, nondistended, no obvious ascites, no peritoneal signs, normal bowel sounds. No organomegaly. Extremities: No edema Psychiatric: alert and oriented x3. Cooperative     ASSESSMENT:  History of multiple adenomatous colon polyps   PLAN:   Surveillance colonoscopy

## 2023-03-02 ENCOUNTER — Telehealth: Payer: Self-pay

## 2023-03-02 NOTE — Telephone Encounter (Signed)
  Follow up Call-     03/01/2023    8:49 AM  Call back number  Post procedure Call Back phone  # [phone]  Permission to leave phone message Yes     Patient questions:  Do you have a fever, pain , or abdominal swelling? No. Pain Score  0 *  Have you tolerated food without any problems? Yes.    Have you been able to return to your normal activities? Yes.    Do you have any questions about your discharge instructions: Diet   No. Medications  No. Follow up visit  No.  Do you have questions or concerns about your Care? No.  Actions: * If pain score is 4 or above: No action needed, pain <4.

## 2023-03-04 ENCOUNTER — Encounter: Payer: Self-pay | Admitting: Internal Medicine

## 2023-06-24 ENCOUNTER — Other Ambulatory Visit: Payer: Self-pay | Admitting: Physical Medicine & Rehabilitation

## 2023-06-24 DIAGNOSIS — M5441 Lumbago with sciatica, right side: Secondary | ICD-10-CM

## 2023-07-01 ENCOUNTER — Encounter: Payer: Self-pay | Admitting: Physical Medicine & Rehabilitation

## 2023-07-07 ENCOUNTER — Other Ambulatory Visit: Payer: Self-pay | Admitting: Physician Assistant

## 2023-07-07 DIAGNOSIS — E7800 Pure hypercholesterolemia, unspecified: Secondary | ICD-10-CM

## 2023-07-07 DIAGNOSIS — R931 Abnormal findings on diagnostic imaging of heart and coronary circulation: Secondary | ICD-10-CM

## 2023-07-07 DIAGNOSIS — I6523 Occlusion and stenosis of bilateral carotid arteries: Secondary | ICD-10-CM

## 2023-07-19 ENCOUNTER — Ambulatory Visit
Admission: RE | Admit: 2023-07-19 | Discharge: 2023-07-19 | Disposition: A | Discharge status: Home / Self Care | Payer: Medicare HMO | Source: Ambulatory Visit | Attending: Physical Medicine & Rehabilitation | Admitting: Physical Medicine & Rehabilitation

## 2023-07-19 DIAGNOSIS — M5441 Lumbago with sciatica, right side: Secondary | ICD-10-CM

## 2023-07-25 ENCOUNTER — Other Ambulatory Visit: Payer: Self-pay | Admitting: Physician Assistant

## 2023-07-25 DIAGNOSIS — I6523 Occlusion and stenosis of bilateral carotid arteries: Secondary | ICD-10-CM

## 2023-07-25 DIAGNOSIS — R931 Abnormal findings on diagnostic imaging of heart and coronary circulation: Secondary | ICD-10-CM

## 2023-07-25 DIAGNOSIS — E7800 Pure hypercholesterolemia, unspecified: Secondary | ICD-10-CM

## 2023-10-21 ENCOUNTER — Other Ambulatory Visit: Payer: Self-pay | Admitting: Cardiology

## 2023-11-22 ENCOUNTER — Other Ambulatory Visit: Payer: Self-pay | Admitting: Cardiology

## 2024-01-31 ENCOUNTER — Other Ambulatory Visit: Payer: Self-pay | Admitting: Internal Medicine

## 2024-01-31 DIAGNOSIS — R3129 Other microscopic hematuria: Secondary | ICD-10-CM

## 2024-02-16 ENCOUNTER — Ambulatory Visit
Admission: RE | Admit: 2024-02-16 | Discharge: 2024-02-16 | Disposition: A | Discharge status: Home / Self Care | Source: Ambulatory Visit | Attending: Internal Medicine | Admitting: Internal Medicine

## 2024-02-16 DIAGNOSIS — R3129 Other microscopic hematuria: Secondary | ICD-10-CM | POA: Insufficient documentation

## 2024-03-03 IMAGING — PT NM PET CT CARDIAC PERF MULTI W/ABSOLUTE BLOODFLOW
8 series · 25 of 25 positions shown · non-contrast
Comparison: Cardiac CT 11/13/2021.

CLINICAL DATA: Chest pain.

EXAM:
PET-CT cardiac perfusion over-read.

[Series 3: ct rest · axial · 3.0mm · 0.98mm/px · 1 of 83 slices shown]
[im 1/83  soft-tissue]
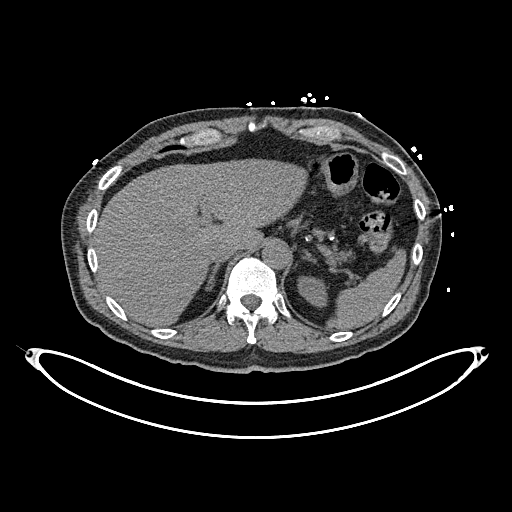

[Series 4: ct rest lung · axial · 3.0mm · 0.98mm/px · 1 of 83 slices shown]
[im 1/83  soft-tissue]
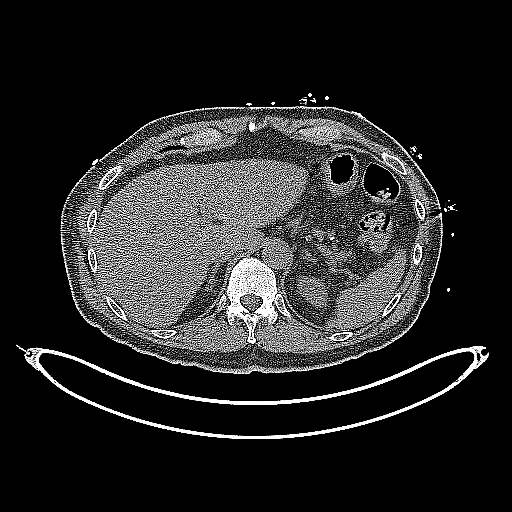

[Series 7: pet rest cardiac dynamic · axial · 3.0mm · 2.04mm/px · z∈[-380,-216]mm · 8 of 2224 slices shown]
[im 1/2224]
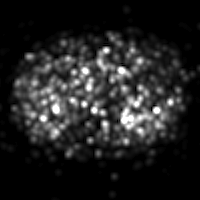
[im 318/2224]
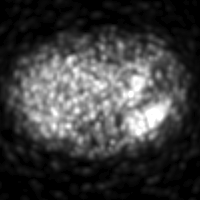
[im 636/2224]
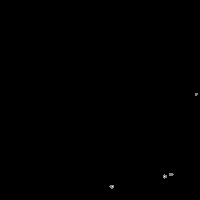
[im 953/2224]
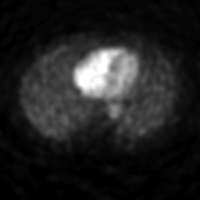
[im 1271/2224]
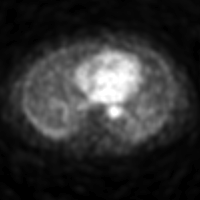
[im 1588/2224]
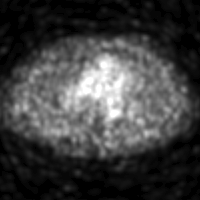
[im 1906/2224]
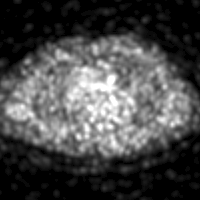
[im 2224/2224]
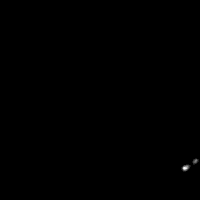

[Series 9: pet rest cardiac gated · axial · 3.0mm · 2.04mm/px · z∈[-380,-216]mm · 2 of 664 slices shown]
[im 1/664]
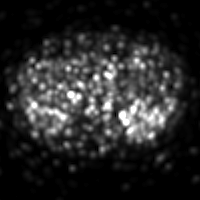
[im 664/664]
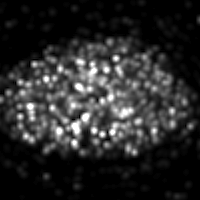

[Series 10: pet rest cardiac static · axial · 3.0mm · 2.04mm/px · 1 of 83 slices shown]
[im 1/83]
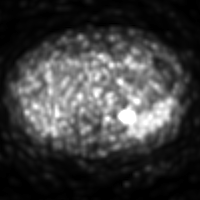

[Series 11: pet stress cardiac dynamic · axial · 3.0mm · 2.04mm/px · z∈[-380,-216]mm · 9 of 2389 slices shown]
[im 1/2389]
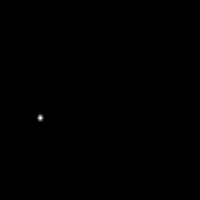
[im 299/2389]
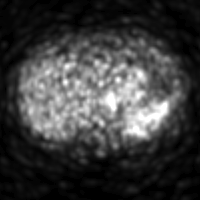
[im 598/2389]
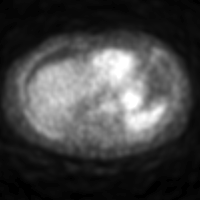
[im 896/2389]
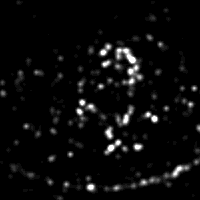
[im 1195/2389]
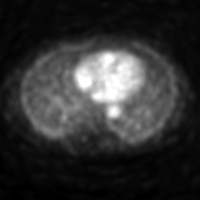
[im 1493/2389]
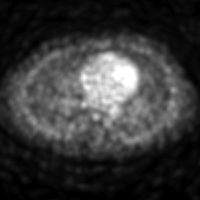
[im 1792/2389]
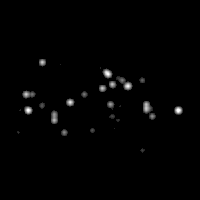
[im 2090/2389]
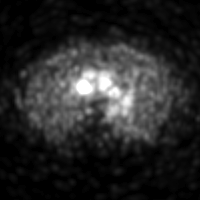
[im 2389/2389]
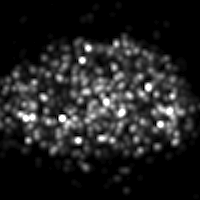

[Series 14: pet stress cardiac gated · axial · 3.0mm · 2.04mm/px · z∈[-380,-216]mm · 2 of 664 slices shown]
[im 1/664]
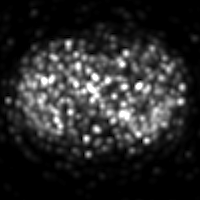
[im 664/664]
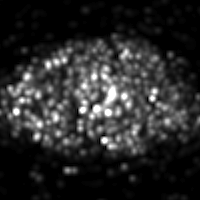

[Series 15: pet stress cardiac ac static · axial · 3.0mm · 2.04mm/px · 1 of 83 slices shown]
[im 1/83]
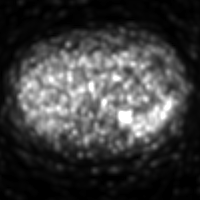

[25 of 25 positions shown; findings below may reference images not displayed]

FINDINGS: No mediastinal or hilar mass or adenopathy. The visualized esophagus
is grossly normal.

Coronary artery calcifications are noted.

The visualized lungs are grossly clear. No worrisome pulmonary
lesions or pulmonary nodules. No pleural effusion. The visualized
upper abdomen is grossly normal. The visualized bony structures are
intact.
IMPRESSION: No significant extracardiac findings.

## 2024-06-19 ENCOUNTER — Other Ambulatory Visit: Payer: Self-pay | Admitting: Internal Medicine

## 2024-06-19 DIAGNOSIS — R1909 Other intra-abdominal and pelvic swelling, mass and lump: Secondary | ICD-10-CM

## 2024-06-19 NOTE — Progress Notes (Signed)
 Goals     . Maintain health/healthy lifestyle

## 2024-06-21 ENCOUNTER — Ambulatory Visit
Admission: RE | Admit: 2024-06-21 | Discharge: 2024-06-21 | Disposition: A | Discharge status: Home / Self Care | Source: Ambulatory Visit | Attending: Internal Medicine | Admitting: Internal Medicine

## 2024-06-21 DIAGNOSIS — R1909 Other intra-abdominal and pelvic swelling, mass and lump: Secondary | ICD-10-CM | POA: Diagnosis present

## 2024-06-21 MED ORDER — IOHEXOL 300 MG/ML  SOLN
100.0000 mL | Freq: Once | INTRAMUSCULAR | Status: AC | PRN
  Administered 2024-06-21: 100 mL via INTRAVENOUS

## 2024-06-26 ENCOUNTER — Ambulatory Visit: Payer: Self-pay | Admitting: Surgery

## 2024-06-26 NOTE — H&P (Addendum)
 Subjective:   CC: Inguinal lymphadenopathy [R59.0]  HPI: referred by Ophelia Marinell Fernande DOUGLAS, MD for evaluation of above.   History of Present Illness Alfred Thomas is a 73 year old male with melanoma who presents with a new lump noticed in the past month. He was referred by Dr. Fernande for evaluation of the lump.  A hard lump in the right groin area was noticed approximately a month to a month and a half ago. Initially considered a possible hernia due to recent physical activity, such as yard work. He has melanoma with previous lesions initially thought to be age spots, monitored for a year as they grew, right arm and shoulder. No other lumps or lesions are present on his body.   Past Medical History:  has a past medical history of Atrial fibrillation and flutter (CMS/HHS-HCC) (10/06/2021), Coronary artery calcification (01/06/2022), Dyslipidemia (10/06/2021), Melanoma (CMS/HHS-HCC), and Rheumatoid arthritis involving multiple sites with positive rheumatoid factor (CMS/HHS-HCC) (06/24/2023).  Past Surgical History: No past surgical history on file.  Family History: family history includes Coronary Artery Disease (Blocked arteries around heart) in his brother and father; Emphysema in his father.  Social History:  reports that he has never smoked. He has never been exposed to tobacco smoke. He has never used smokeless tobacco. He reports current alcohol use. He reports that he does not currently use drugs.  Current Medications: has a current medication list which includes the following prescription(s): aspirin, diltiazem , repatha  sureclick, folic acid, hydroxychloroquine, methotrexate, and albuterol mdi (proventil, ventolin, proair) hfa.  Allergies:  Allergies as of 06/26/2024 - Reviewed 06/26/2024  Allergen Reaction Noted   Atorvastatin Muscle Pain 01/06/2022   Crestor [rosuvastatin] Other (See Comments) 01/06/2022    ROS:  A 15 point review of systems was performed and pertinent positives  and negatives noted in HPI   Objective:     BP 119/77   Pulse 80   Ht 193 cm (6' 4)   Wt 88.5 kg (195 lb)   BMI 23.74 kg/m   Constitutional :  Alert, cooperative, no distress  Lymphatics/Throat:  Supple, no lymphadenopathy  Respiratory:  clear to auscultation bilaterally  Cardiovascular:  regular rate and rhythm  Gastrointestinal: soft, non-tender; bowel sounds normal; no masses,  no organomegaly. Right inguinal lymphadenopathy palpable as noted on CT.    Musculoskeletal: Steady gait and movement  Skin: Cool and moist, no obvious lesions noted on visible leg or perianal area.  Psychiatric: Normal affect, non-agitated, not confused       LABS:  N/a   RADS: EXAM: CT ABDOMEN AND PELVIS WITHOUT AND WITH CONTRAST 06/21/2024 02:21:49 PM   TECHNIQUE: CT of the abdomen and pelvis was performed without and with the administration of 100 mL of iohexol (OMNIPAQUE) 300 MG/ML solution intravenous contrast. Multiplanar reformatted images are provided for review. Automated exposure control, iterative reconstruction, and/or weight-based adjustment of the mA/kV was utilized to reduce the radiation dose to as low as reasonably achievable.   COMPARISON: CT 02/17/2024   CLINICAL HISTORY: right inguinal mass history of melanoma. * Tracking Code: BO *   FINDINGS:   LOWER CHEST: No acute abnormality.   LIVER: No liver metastasis.   GALLBLADDER AND BILE DUCTS: Gallbladder is unremarkable. No biliary ductal dilatation.   SPLEEN: No acute abnormality.   PANCREAS: No acute abnormality.   ADRENAL GLANDS: No acute abnormality.   KIDNEYS, URETERS AND BLADDER: No stones in the kidneys or ureters. No hydronephrosis. No perinephric or periureteral stranding. Urinary bladder is unremarkable.   GI  AND BOWEL: Stomach demonstrates no acute abnormality. There is no bowel obstruction.   PERITONEUM AND RETROPERITONEUM: No ascites. No free air. No intraperitoneal nodularity.    VASCULATURE: Aorta is normal in caliber.   LYMPH NODES: No lymphadenopathy.   REPRODUCTIVE ORGANS: No acute abnormality.   BONES AND SOFT TISSUES: Rounded enhancing mass in the right inguinal space measuring 2.2 x 2.1 cm on image 84 series 7. This mass approaches the skin surface and is adjacent to the skin marker. Paracalcified nodule in the left ventral peroneal space is unchanged. Tube. No acute osseous abnormality.   IMPRESSION: 1. Rounded enhancing right inguinal mass measuring 2.2 x 2.1 cm, suspicious finding in patient with melanoma history. Recommend tissue sampling and/or FDG PET scan. 2. No liver metastases. 3. These results will be called to the ordering clinician or representative by the radiologist assistant, and communication documented in the pacs or clario dashboard   Electronically signed by: Norleen Boxer MD 06/21/2024 02:41 PM EST RP Workstation: [id number]   Assessment:       Inguinal lymphadenopathy [R59.0]-recommend excisional biopsy for further evaluation.  Plan:     1. Inguinal lymphadenopathy [R59.0]   Discussed the risk of surgery including bleeding, post-op infxn, Alternatives include continued observation.  Benefits include pathologic evaluation.  The patient verbalized understanding and all questions were answered to the patient's satisfaction.   2. Patient has elected to proceed with surgical treatment. Procedure will be scheduled.   Proceed urgently due to concern for possible malignancy, will discuss rheum meds with provider prior, but likely will still proceed with surgery as scheduled  labs/images/medications/previous chart entries reviewed personally and relevant changes/updates noted above.

## 2024-06-26 NOTE — H&P (View-Only) (Signed)
 Subjective:   CC: Inguinal lymphadenopathy [R59.0]  HPI: referred by Ophelia Marinell Fernande DOUGLAS, MD for evaluation of above.   History of Present Illness Alfred Thomas is a 73 year old male with melanoma who presents with a new lump noticed in the past month. He was referred by Dr. Fernande for evaluation of the lump.  A hard lump in the right groin area was noticed approximately a month to a month and a half ago. Initially considered a possible hernia due to recent physical activity, such as yard work. He has melanoma with previous lesions initially thought to be age spots, monitored for a year as they grew, right arm and shoulder. No other lumps or lesions are present on his body.   Past Medical History:  has a past medical history of Atrial fibrillation and flutter (CMS/HHS-HCC) (10/06/2021), Coronary artery calcification (01/06/2022), Dyslipidemia (10/06/2021), Melanoma (CMS/HHS-HCC), and Rheumatoid arthritis involving multiple sites with positive rheumatoid factor (CMS/HHS-HCC) (06/24/2023).  Past Surgical History: No past surgical history on file.  Family History: family history includes Coronary Artery Disease (Blocked arteries around heart) in his brother and father; Emphysema in his father.  Social History:  reports that he has never smoked. He has never been exposed to tobacco smoke. He has never used smokeless tobacco. He reports current alcohol use. He reports that he does not currently use drugs.  Current Medications: has a current medication list which includes the following prescription(s): aspirin, diltiazem , repatha  sureclick, folic acid, hydroxychloroquine, methotrexate, and albuterol mdi (proventil, ventolin, proair) hfa.  Allergies:  Allergies as of 06/26/2024 - Reviewed 06/26/2024  Allergen Reaction Noted   Atorvastatin Muscle Pain 01/06/2022   Crestor [rosuvastatin] Other (See Comments) 01/06/2022    ROS:  A 15 point review of systems was performed and pertinent positives  and negatives noted in HPI   Objective:     BP 119/77   Pulse 80   Ht 193 cm (6' 4)   Wt 88.5 kg (195 lb)   BMI 23.74 kg/m   Constitutional :  Alert, cooperative, no distress  Lymphatics/Throat:  Supple, no lymphadenopathy  Respiratory:  clear to auscultation bilaterally  Cardiovascular:  regular rate and rhythm  Gastrointestinal: soft, non-tender; bowel sounds normal; no masses,  no organomegaly. Right inguinal lymphadenopathy palpable as noted on CT.    Musculoskeletal: Steady gait and movement  Skin: Cool and moist, no obvious lesions noted on visible leg or perianal area.  Psychiatric: Normal affect, non-agitated, not confused       LABS:  N/a   RADS: EXAM: CT ABDOMEN AND PELVIS WITHOUT AND WITH CONTRAST 06/21/2024 02:21:49 PM   TECHNIQUE: CT of the abdomen and pelvis was performed without and with the administration of 100 mL of iohexol (OMNIPAQUE) 300 MG/ML solution intravenous contrast. Multiplanar reformatted images are provided for review. Automated exposure control, iterative reconstruction, and/or weight-based adjustment of the mA/kV was utilized to reduce the radiation dose to as low as reasonably achievable.   COMPARISON: CT 02/17/2024   CLINICAL HISTORY: right inguinal mass history of melanoma. * Tracking Code: BO *   FINDINGS:   LOWER CHEST: No acute abnormality.   LIVER: No liver metastasis.   GALLBLADDER AND BILE DUCTS: Gallbladder is unremarkable. No biliary ductal dilatation.   SPLEEN: No acute abnormality.   PANCREAS: No acute abnormality.   ADRENAL GLANDS: No acute abnormality.   KIDNEYS, URETERS AND BLADDER: No stones in the kidneys or ureters. No hydronephrosis. No perinephric or periureteral stranding. Urinary bladder is unremarkable.   GI  AND BOWEL: Stomach demonstrates no acute abnormality. There is no bowel obstruction.   PERITONEUM AND RETROPERITONEUM: No ascites. No free air. No intraperitoneal nodularity.    VASCULATURE: Aorta is normal in caliber.   LYMPH NODES: No lymphadenopathy.   REPRODUCTIVE ORGANS: No acute abnormality.   BONES AND SOFT TISSUES: Rounded enhancing mass in the right inguinal space measuring 2.2 x 2.1 cm on image 84 series 7. This mass approaches the skin surface and is adjacent to the skin marker. Paracalcified nodule in the left ventral peroneal space is unchanged. Tube. No acute osseous abnormality.   IMPRESSION: 1. Rounded enhancing right inguinal mass measuring 2.2 x 2.1 cm, suspicious finding in patient with melanoma history. Recommend tissue sampling and/or FDG PET scan. 2. No liver metastases. 3. These results will be called to the ordering clinician or representative by the radiologist assistant, and communication documented in the pacs or clario dashboard   Electronically signed by: Norleen Boxer MD 06/21/2024 02:41 PM EST RP Workstation: [id number]   Assessment:       Inguinal lymphadenopathy [R59.0]-recommend excisional biopsy for further evaluation.  Plan:     1. Inguinal lymphadenopathy [R59.0]   Discussed the risk of surgery including bleeding, post-op infxn, Alternatives include continued observation.  Benefits include pathologic evaluation.  The patient verbalized understanding and all questions were answered to the patient's satisfaction.   2. Patient has elected to proceed with surgical treatment. Procedure will be scheduled.   Proceed urgently due to concern for possible malignancy, will discuss rheum meds with provider prior, but likely will still proceed with surgery as scheduled  labs/images/medications/previous chart entries reviewed personally and relevant changes/updates noted above.

## 2024-06-28 ENCOUNTER — Encounter
Admission: RE | Admit: 2024-06-28 | Discharge: 2024-06-28 | Disposition: A | Discharge status: Home / Self Care | Source: Ambulatory Visit | Attending: Surgery | Admitting: Surgery

## 2024-06-28 ENCOUNTER — Other Ambulatory Visit: Payer: Self-pay

## 2024-06-28 HISTORY — DX: Hyperlipidemia, unspecified: E78.5

## 2024-06-28 HISTORY — DX: Other intra-abdominal and pelvic swelling, mass and lump: R19.09

## 2024-06-28 HISTORY — DX: Personal history of malignant melanoma of skin: Z85.820

## 2024-06-28 HISTORY — DX: Rheumatoid arthritis with rheumatoid factor of multiple sites without organ or systems involvement: M05.79

## 2024-06-28 HISTORY — DX: Long term (current) use of unspecified immunomodulators and immunosuppressants: Z79.60

## 2024-06-28 HISTORY — DX: Atherosclerotic heart disease of native coronary artery without angina pectoris: I25.10

## 2024-06-28 HISTORY — DX: Atrial premature depolarization: I49.1

## 2024-06-28 HISTORY — DX: Thrombocytopenia, unspecified: D69.6

## 2024-06-28 MED ORDER — ORAL CARE MOUTH RINSE: 15.0000 mL | Freq: Once | OROMUCOSAL | Status: AC

## 2024-06-28 MED ORDER — CHLORHEXIDINE GLUCONATE 0.12 % MT SOLN
15.0000 mL | Freq: Once | OROMUCOSAL | Status: AC
  Administered 2024-06-29: 15 mL via OROMUCOSAL

## 2024-06-28 MED ORDER — LACTATED RINGERS IV SOLN: INTRAVENOUS | Status: DC

## 2024-06-28 MED ORDER — CHLORHEXIDINE GLUCONATE CLOTH 2 % EX PADS
6.0000 | MEDICATED_PAD | Freq: Once | CUTANEOUS | Status: AC
  Administered 2024-06-29: 6 via TOPICAL

## 2024-06-28 MED ORDER — CEFAZOLIN SODIUM-DEXTROSE 2-4 GM/100ML-% IV SOLN
2.0000 g | INTRAVENOUS | Status: AC
  Administered 2024-06-29: 2 g via INTRAVENOUS

## 2024-06-28 NOTE — Patient Instructions (Addendum)
 Your procedure is scheduled on: Friday 06/29/24  Report to the Registration Desk on the 1st floor of the [facility]. Arrive at 12:30    REMEMBER: Instructions that are not followed completely may result in serious medical risk, up to and including death; or upon the discretion of your surgeon and anesthesiologist your surgery may need to be rescheduled.  Do not eat food after midnight the night before surgery.  No gum chewing or hard candies.  You may however, drink CLEAR liquids up to 2 hours before you are scheduled to arrive for your surgery. Do not drink anything within 2 hours of your scheduled arrival time.  Clear liquids include: - water  - black coffee or tea (Do NOT add milk or creamers to the coffee or tea) Do NOT drink anything that is not on this list.   One week prior to surgery: Stop Anti-inflammatories (NSAIDS) such as Advil, Aleve, Ibuprofen, Motrin, Naproxen, Naprosyn and Aspirin based products such as Excedrin, Goody's Powder, BC Powder. Stop ANY OVER THE COUNTER supplements until after surgery.  You may however, continue to take Tylenol  if needed for pain up until the day of surgery.   Continue taking all of your other prescription medications up until the day of surgery.  ON THE DAY OF SURGERY ONLY TAKE THESE MEDICATIONS WITH SIPS OF WATER:  diltiazem  (CARDIZEM  CD) 240 MG    No Alcohol for 24 hours before or after surgery.  No Smoking including e-cigarettes for 24 hours before surgery.  No chewable tobacco products for at least 6 hours before surgery.  No nicotine patches on the day of surgery.  Do not use any recreational drugs for at least a week (preferably 2 weeks) before your surgery.  Please be advised that the combination of cocaine and anesthesia may have negative outcomes, up to and including death. If you test positive for cocaine, your surgery will be cancelled.  On the morning of surgery brush your teeth with toothpaste and water, you may  rinse your mouth with mouthwash if you wish. Do not swallow any toothpaste or mouthwash.  Use CHG Soap or wipes as directed on instruction sheet.  Do not wear jewelry, make-up, hairpins, clips or nail polish.  For welded (permanent) jewelry: bracelets, anklets, waist bands, etc.  Please have this removed prior to surgery.  If it is not removed, there is a chance that hospital personnel will need to cut it off on the day of surgery.  Do not wear lotions, powders, or perfumes.   Do not shave body hair from the neck down 48 hours before surgery.  Contact lenses, hearing aids and dentures may not be worn into surgery.  Do not bring valuables to the hospital. [facility] is not responsible for any missing/lost belongings or valuables.   Notify your doctor if there is any change in your medical condition (cold, fever, infection).  Wear comfortable clothing (specific to your surgery type) to the hospital.  After surgery, you can help prevent lung complications by doing breathing exercises.  Take deep breaths and cough every 1-2 hours. Your doctor may order a device called an Incentive Spirometer to help you take deep breaths. When coughing or sneezing, hold a pillow firmly against your incision with both hands. This is called "splinting." Doing this helps protect your incision. It also decreases belly discomfort.  If you are being admitted to the hospital overnight, leave your suitcase in the car. After surgery it may be brought to your room.  In case of increased patient census, it may be necessary for you, the patient, to continue your postoperative care in the Same Day Surgery department.  If you are being discharged the day of surgery, you will not be allowed to drive home. You will need a responsible individual to drive you home and stay with you for 24 hours after surgery.   If you are taking public transportation, you will need to have a responsible individual with you.  Please call  the Pre-admissions Testing Dept. at [phone] if you have any questions about these instructions.  Surgery Visitation Policy:  Patients having surgery or a procedure may have two visitors.  Children under the age of 24 must have an adult with them who is not the patient.  Inpatient Visitation:    Visiting hours are 7 a.m. to 8 p.m. Up to four visitors are allowed at one time in a patient room. The visitors may rotate out with other people during the day.  One visitor age 75 or older may stay with the patient overnight and must be in the room by 8 p.m.   Merchandiser, Retail to address health-related social needs:  [email or web address]                                                                                                            Preparing for Surgery with CHLORHEXIDINE GLUCONATE (CHG) Soap  Chlorhexidine Gluconate (CHG) Soap  o An antiseptic cleaner that kills germs and bonds with the skin to continue killing germs even after washing  o Used for showering the night before surgery and morning of surgery  Before surgery, you can play an important role by reducing the number of germs on your skin.  CHG (Chlorhexidine gluconate) soap is an antiseptic cleanser which kills germs and bonds with the skin to continue killing germs even after washing.  Please do not use if you have an allergy to CHG or antibacterial soaps. If your skin becomes reddened/irritated stop using the CHG.  1. Shower the NIGHT BEFORE SURGERY with CHG soap.  2. If you choose to wash your hair, wash your hair first as usual with your normal shampoo.  3. After shampooing, rinse your hair and body thoroughly to remove the shampoo.  4. Use CHG as you would any other liquid soap. You can apply CHG directly to the skin and wash gently with a clean washcloth.  5. Apply the CHG soap to your body only from the neck down. Do not use on open wounds or open sores. Avoid contact with your  eyes, ears, mouth, and genitals (private parts). Wash face and genitals (private parts) with your normal soap.  6. Wash thoroughly, paying special attention to the area where your surgery will be performed.  7. Thoroughly rinse your body with warm water.  8. Do not shower/wash with your normal soap after using and rinsing off the CHG soap.  9. Do not use lotions, oils, etc., after showering with CHG.  10. Pat yourself dry with a clean towel.  11. Wear clean pajamas to bed the night before surgery.  12. Place clean sheets on your bed the night of your shower and do not sleep with pets.  13. Do not apply any deodorants/lotions/powders.  14. Please wear clean clothes to the hospital.  15. Remember to brush your teeth with your regular toothpaste.

## 2024-06-29 ENCOUNTER — Ambulatory Visit: Admitting: General Practice

## 2024-06-29 ENCOUNTER — Encounter: Payer: Self-pay | Admitting: Surgery

## 2024-06-29 ENCOUNTER — Ambulatory Visit
Admission: RE | Admit: 2024-06-29 | Discharge: 2024-06-29 | Disposition: A | Discharge status: Home / Self Care | Attending: Surgery | Admitting: Surgery

## 2024-06-29 ENCOUNTER — Other Ambulatory Visit: Payer: Self-pay

## 2024-06-29 ENCOUNTER — Encounter: Admission: RE | Disposition: A | Payer: Self-pay | Source: Home / Self Care | Attending: Surgery

## 2024-06-29 DIAGNOSIS — C774 Secondary and unspecified malignant neoplasm of inguinal and lower limb lymph nodes: Secondary | ICD-10-CM | POA: Diagnosis not present

## 2024-06-29 DIAGNOSIS — C439 Malignant melanoma of skin, unspecified: Secondary | ICD-10-CM | POA: Insufficient documentation

## 2024-06-29 DIAGNOSIS — R590 Localized enlarged lymph nodes: Secondary | ICD-10-CM | POA: Diagnosis present

## 2024-06-29 HISTORY — PX: SENTINEL NODE BIOPSY: SHX6608

## 2024-06-29 LAB — GLUCOSE, CAPILLARY: Glucose-Capillary: 109 mg/dL — ABNORMAL HIGH (ref 70–99)

## 2024-06-29 SURGERY — BIOPSY, LYMPH NODE, SENTINEL
Anesthesia: General | Site: Groin | Laterality: Right

## 2024-06-29 MED ORDER — BUPIVACAINE-EPINEPHRINE (PF) 0.5% -1:200000 IJ SOLN
INTRAMUSCULAR | Status: DC | PRN
Start: 2024-06-29 — End: 2024-06-29
  Administered 2024-06-29: 18 mL

## 2024-06-29 MED ORDER — KETOROLAC TROMETHAMINE 30 MG/ML IJ SOLN
INTRAMUSCULAR | Status: DC | PRN
Start: 2024-06-29 — End: 2024-06-29
  Administered 2024-06-29: 15 mg via INTRAVENOUS

## 2024-06-29 MED ORDER — KETOROLAC TROMETHAMINE 30 MG/ML IJ SOLN
INTRAMUSCULAR | Status: AC
  Filled 2024-06-29: qty 1

## 2024-06-29 MED ORDER — OXYCODONE HCL 5 MG PO TABS: 5.0000 mg | ORAL_TABLET | Freq: Once | ORAL | Status: DC | PRN

## 2024-06-29 MED ORDER — FENTANYL CITRATE (PF) 100 MCG/2ML IJ SOLN: 25.0000 ug | INTRAMUSCULAR | Status: DC | PRN

## 2024-06-29 MED ORDER — ACETAMINOPHEN 10 MG/ML IV SOLN
INTRAVENOUS | Status: AC
  Filled 2024-06-29: qty 100

## 2024-06-29 MED ORDER — FENTANYL CITRATE (PF) 100 MCG/2ML IJ SOLN
INTRAMUSCULAR | Status: DC | PRN
  Administered 2024-06-29 (×2): 50 ug via INTRAVENOUS

## 2024-06-29 MED ORDER — LIDOCAINE HCL (CARDIAC) PF 100 MG/5ML IV SOSY
PREFILLED_SYRINGE | INTRAVENOUS | Status: DC | PRN
  Administered 2024-06-29: 80 mg via INTRAVENOUS

## 2024-06-29 MED ORDER — FENTANYL CITRATE (PF) 100 MCG/2ML IJ SOLN
INTRAMUSCULAR | Status: AC
  Filled 2024-06-29: qty 2

## 2024-06-29 MED ORDER — OXYCODONE HCL 5 MG/5ML PO SOLN: 5.0000 mg | Freq: Once | ORAL | Status: DC | PRN

## 2024-06-29 MED ORDER — ACETAMINOPHEN 325 MG PO TABS
650.0000 mg | ORAL_TABLET | Freq: Three times a day (TID) | ORAL | 0 refills | Status: AC | PRN
  Filled 2024-06-29: qty 40, 7d supply, fill #0

## 2024-06-29 MED ORDER — CEFAZOLIN SODIUM-DEXTROSE 2-4 GM/100ML-% IV SOLN
INTRAVENOUS | Status: AC
  Filled 2024-06-29: qty 100

## 2024-06-29 MED ORDER — PROPOFOL 10 MG/ML IV BOLUS
INTRAVENOUS | Status: AC
  Filled 2024-06-29: qty 20

## 2024-06-29 MED ORDER — DEXAMETHASONE SOD PHOSPHATE PF 10 MG/ML IJ SOLN
INTRAMUSCULAR | Status: DC | PRN
  Administered 2024-06-29: 5 mg via INTRAVENOUS

## 2024-06-29 MED ORDER — ACETAMINOPHEN 10 MG/ML IV SOLN
INTRAVENOUS | Status: DC | PRN
  Administered 2024-06-29: 1000 mg via INTRAVENOUS

## 2024-06-29 MED ORDER — 0.9 % SODIUM CHLORIDE (POUR BTL) OPTIME
TOPICAL | Status: DC | PRN
  Administered 2024-06-29: 500 mL

## 2024-06-29 MED ORDER — CHLORHEXIDINE GLUCONATE 0.12 % MT SOLN
OROMUCOSAL | Status: AC
  Filled 2024-06-29: qty 15

## 2024-06-29 MED ORDER — EPHEDRINE SULFATE-NACL 50-0.9 MG/10ML-% IV SOSY
PREFILLED_SYRINGE | INTRAVENOUS | Status: DC | PRN
  Administered 2024-06-29 (×3): 5 mg via INTRAVENOUS

## 2024-06-29 MED ORDER — PROPOFOL 10 MG/ML IV BOLUS
INTRAVENOUS | Status: DC | PRN
  Administered 2024-06-29: 150 mg via INTRAVENOUS

## 2024-06-29 MED ORDER — OXYCODONE-ACETAMINOPHEN 5-325 MG PO TABS
1.0000 | ORAL_TABLET | Freq: Three times a day (TID) | ORAL | 0 refills | Status: AC | PRN
  Filled 2024-06-29: qty 6, 2d supply, fill #0

## 2024-06-29 MED ORDER — ONDANSETRON HCL 4 MG/2ML IJ SOLN
INTRAMUSCULAR | Status: DC | PRN
  Administered 2024-06-29: 4 mg via INTRAVENOUS

## 2024-06-29 MED ORDER — LIDOCAINE HCL (PF) 2 % IJ SOLN
INTRAMUSCULAR | Status: AC
  Filled 2024-06-29: qty 5

## 2024-06-29 MED ORDER — PHENYLEPHRINE 80 MCG/ML (10ML) SYRINGE FOR IV PUSH (FOR BLOOD PRESSURE SUPPORT)
PREFILLED_SYRINGE | INTRAVENOUS | Status: DC | PRN
  Administered 2024-06-29 (×3): 80 ug via INTRAVENOUS

## 2024-06-29 MED ORDER — BUPIVACAINE-EPINEPHRINE (PF) 0.5% -1:200000 IJ SOLN
INTRAMUSCULAR | Status: AC
  Filled 2024-06-29: qty 30

## 2024-06-29 MED ORDER — DEXMEDETOMIDINE HCL IN NACL 80 MCG/20ML IV SOLN
INTRAVENOUS | Status: DC | PRN
  Administered 2024-06-29: 4 ug via INTRAVENOUS

## 2024-06-29 SURGICAL SUPPLY — 28 items
BLADE CLIPPER SURG (BLADE) IMPLANT
BLADE SURG 15 STRL LF DISP TIS (BLADE) ×1 IMPLANT
CHLORAPREP W/TINT 26 (MISCELLANEOUS) IMPLANT
DERMABOND ADVANCED .7 DNX12 (GAUZE/BANDAGES/DRESSINGS) ×1 IMPLANT
DISSECTOR SURG LIGASURE 21 (MISCELLANEOUS) IMPLANT
DRAPE LAPAROTOMY 100X77 ABD (DRAPES) ×1 IMPLANT
ELECTRODE REM PT RTRN 9FT ADLT (ELECTROSURGICAL) ×1 IMPLANT
GLOVE BIOGEL PI IND STRL 7.0 (GLOVE) ×1 IMPLANT
GLOVE SURG SYN 6.5 PF PI (GLOVE) ×3 IMPLANT
GOWN STRL REUS W/ TWL LRG LVL3 (GOWN DISPOSABLE) ×3 IMPLANT
LABEL OR SOLS (LABEL) ×1 IMPLANT
MANIFOLD NEPTUNE II (INSTRUMENTS) ×1 IMPLANT
NDL HYPO 22X1.5 SAFETY MO (MISCELLANEOUS) ×1 IMPLANT
NEEDLE HYPO 22X1.5 SAFETY MO (MISCELLANEOUS) ×1 IMPLANT
NS IRRIG 500ML POUR BTL (IV SOLUTION) ×1 IMPLANT
PACK BASIN MINOR ARMC (MISCELLANEOUS) ×1 IMPLANT
SOLN STERILE WATER 500 ML (IV SOLUTION) ×1 IMPLANT
SOLN STERILE WATER BTL 1000 ML (IV SOLUTION) ×1 IMPLANT
SUT SILK 3 0 12 30 (SUTURE) IMPLANT
SUT SILK 3 0 SH 30 (SUTURE) IMPLANT
SUT VIC AB 2-0 CT2 27 (SUTURE) ×1 IMPLANT
SUT VIC AB 3-0 SH 27X BRD (SUTURE) ×1 IMPLANT
SUTURE MNCRL 4-0 27XMF (SUTURE) ×1 IMPLANT
SWAB CULTURE AMIES ANAERIB BLU (MISCELLANEOUS) IMPLANT
SYR 10ML LL (SYRINGE) ×1 IMPLANT
SYR BULB IRRIG 60ML STRL (SYRINGE) ×1 IMPLANT
TOWEL OR 17X26 4PK STRL BLUE (TOWEL DISPOSABLE) ×1 IMPLANT
TRAP FLUID SMOKE EVACUATOR (MISCELLANEOUS) ×1 IMPLANT

## 2024-06-29 NOTE — Interval H&P Note (Signed)
 No change. Ok to proceed

## 2024-06-29 NOTE — Transfer of Care (Signed)
 Immediate Anesthesia Transfer of Care Note  Patient: Alfred Thomas.  Procedure(s) Performed: BIOPSY, LYMPH NODE, INGUINAL (Right: Groin)  Patient Location: PACU  Anesthesia Type:General  Level of Consciousness: awake, alert , oriented, and patient cooperative  Airway & Oxygen Therapy: Patient Spontanous Breathing and Patient connected to face mask oxygen  Post-op Assessment: Report given to RN, Post -op Vital signs reviewed and stable, and Patient moving all extremities X 4  Post vital signs: Reviewed and stable  Last Vitals:  Vitals Value Taken Time  BP 135/72 06/29/24 15:45  Temp 36.2 C 06/29/24 15:45  Pulse 83 06/29/24 15:45  Resp 20 06/29/24 15:45  SpO2 100 % 06/29/24 15:45  Vitals shown include unfiled device data.  Last Pain:  Vitals:   06/29/24 1545  TempSrc:   PainSc: Asleep         Complications: No notable events documented.

## 2024-06-29 NOTE — Anesthesia Procedure Notes (Signed)
 Procedure Name: LMA Insertion Date/Time: 06/29/2024 2:31 PM  Performed by: Myra Lawless, CRNAPre-anesthesia Checklist: Patient identified, Patient being monitored, Timeout performed, Emergency Drugs available and Suction available Patient Re-evaluated:Patient Re-evaluated prior to induction Oxygen Delivery Method: Circle system utilized Preoxygenation: Pre-oxygenation with 100% oxygen Induction Type: IV induction Ventilation: Mask ventilation without difficulty LMA: LMA inserted LMA Size: 5.0 Tube type: Oral Number of attempts: 1 Placement Confirmation: positive ETCO2 and breath sounds checked- equal and bilateral Tube secured with: Tape Dental Injury: Teeth and Oropharynx as per pre-operative assessment

## 2024-06-29 NOTE — Discharge Instructions (Signed)
 Removal, Care After This sheet gives you information about how to care for yourself after your procedure. Your health care provider may also give you more specific instructions. If you have problems or questions, contact your health care provider. What can I expect after the procedure? After the procedure, it is common to have: Soreness. Bruising. Itching. Follow these instructions at home: site care Follow instructions from your health care provider about how to take care of your site. Make sure you: Wash your hands with soap and water before and after you change your bandage (dressing). If soap and water are not available, use hand sanitizer. Leave stitches (sutures), skin glue, or adhesive strips in place. These skin closures may need to stay in place for 2 weeks or longer. If adhesive strip edges start to loosen and curl up, you may trim the loose edges. Do not remove adhesive strips completely unless your health care provider tells you to do that. If the area bleeds or bruises, apply gentle pressure for 10 minutes. OK TO SHOWER IN 24HRS  Check your site every day for signs of infection. Check for: Redness, swelling, or pain. Fluid or blood. Warmth. Pus or a bad smell.  General instructions Rest and then return to your normal activities as told by your health care provider.  tylenol  as needed for discomfort.    Use narcotics, if prescribed, only when tylenol   is not enough to control pain.  325-650mg  every 8hrs to max of 3000mg /24hrs (including the 325mg  in every norco dose) for the tylenol .   Resume aspirin in 48 hours Keep all follow-up visits as told by your health care provider. This is important. Contact a health care provider if: You have redness, swelling, or pain around your site. You have fluid or blood coming from your site. Your site feels warm to the touch. You have pus or a bad smell coming from your site. You have a fever. Your sutures, skin glue, or adhesive  strips loosen or come off sooner than expected. Get help right away if: You have bleeding that does not stop with pressure or a dressing. Summary After the procedure, it is common to have some soreness, bruising, and itching at the site. Follow instructions from your health care provider about how to take care of your site. Check your site every day for signs of infection. Contact a health care provider if you have redness, swelling, or pain around your site, or your site feels warm to the touch. Keep all follow-up visits as told by your health care provider. This is important. This information is not intended to replace advice given to you by your health care provider. Make sure you discuss any questions you have with your health care provider. Document Released: 08/22/2015 Document Revised: 01/23/2018 Document Reviewed: 01/23/2018 Elsevier Interactive Patient Education  [employer].

## 2024-06-29 NOTE — Anesthesia Preprocedure Evaluation (Signed)
 Anesthesia Evaluation  Patient identified by MRN, date of birth, ID band Patient awake    Reviewed: Allergy & Precautions, NPO status , Patient's Chart, lab work & pertinent test results  Airway Mallampati: III  TM Distance: >3 FB Neck ROM: full    Dental  (+) Chipped   Pulmonary neg pulmonary ROS   Pulmonary exam normal        Cardiovascular + dysrhythmias Atrial Fibrillation      Neuro/Psych negative neurological ROS  negative psych ROS   GI/Hepatic negative GI ROS, Neg liver ROS,,,  Endo/Other  negative endocrine ROSdiabetes    Renal/GU      Musculoskeletal   Abdominal   Peds  Hematology negative hematology ROS (+)   Anesthesia Other Findings Past Medical History: No date: Allergy No date: Atrial fibrillation (HCC) No date: Cancer [facility])     Comment:  melanoma skin cancer shoulder No date: Coronary artery disease involving native coronary artery of  native heart without angina pectoris No date: Diabetes mellitus without complication (HCC) No date: Diverticulitis No date: Dyslipidemia No date: H/O echocardiogram No date: History of melanoma No date: Hyperglycemia No date: Hyperlipidemia No date: Long-term use of immunosuppressant medication No date: Mass of right inguinal region No date: PAC (premature atrial contraction) No date: Palpitations No date: Rheumatoid arthritis involving multiple sites with positive  rheumatoid factor (HCC) No date: Thrombocytopenia  Past Surgical History: No date: COLONOSCOPY     Comment:  x2; last 2016 03/01/2023: COLONOSCOPY W/ POLYPECTOMY     Comment:  Norleen Kiang at [facility] No date: POLYPECTOMY No date: TONSILLECTOMY     Comment:  as a child No date: WISDOM TOOTH EXTRACTION  BMI    Body Mass Index: 24.34 kg/m      Reproductive/Obstetrics negative OB ROS                              Anesthesia Physical Anesthesia Plan  ASA:  2  Anesthesia Plan: General ETT   Post-op Pain Management:    Induction: Intravenous  PONV Risk Score and Plan: 2 and Ondansetron  and Dexamethasone   Airway Management Planned: LMA  Additional Equipment:   Intra-op Plan:   Post-operative Plan: Extubation in OR  Informed Consent: I have reviewed the patients History and Physical, chart, labs and discussed the procedure including the risks, benefits and alternatives for the proposed anesthesia with the patient or authorized representative who has indicated his/her understanding and acceptance.     Dental Advisory Given  Plan Discussed with: Anesthesiologist, CRNA and Surgeon  Anesthesia Plan Comments: (Patient consented for risks of anesthesia including but not limited to:  - adverse reactions to medications - damage to eyes, teeth, lips or other oral mucosa - nerve damage due to positioning  - sore throat or hoarseness - Damage to heart, brain, nerves, lungs, other parts of body or loss of life  Patient voiced understanding and assent.)        Anesthesia Quick Evaluation

## 2024-06-29 NOTE — Op Note (Signed)
 Pre-Op Dx: Right groin lymphadenopathy Post-Op Dx: Same Anesthesia: GETA EBL: 10 mL Complications:  none apparent Specimen: Right groin lymph node Procedure: excisional biopsy of right groin lymph node Surgeon: Tye  Indications for procedure: See H&P  Description of Procedure:  Consent obtained, time out performed.  Patient placed in supine position.  Area sterilized and draped in usual position.  Local infused to area previously marked.  8 cm incision made through dermis with 15blade and enlarged lymph node noted in subcutaneous layer.  There was some extensive scarring and adhesions of the lymph node to the surrounding tissue.  Area was meticulously and carefully dissected using a combination of blunt dissection, LigaSure, and electrocautery, care to not injure the adjacent femoral vessels.  Portion of the lymph node had to be removed in a piecemeal fashion due to the extensive scarring, but it was eventually removed completely.  Reinspection of the area noted some scarring and a possible inflammatory layer, with possible purulent drainage noted during the dissection.  The purulent drainage was sent for culture.  No obvious bleeding was noted and the femoral artery was palpable deep to the area of dissection.  Operative area extensively irrigated, wound hemostasis noted, then closed in two layer fashion with 3-0 vicryl in interrupted fashion for deep dermal layer, then running 4-0 monocryl in subcuticular fashion for epidermal layer.  Wound then dressed with dermabond.  Pt tolerated procedure well, and transferred to PACU in stable condition. Sponge and instrument count correct at end of procedure.

## 2024-07-01 NOTE — Anesthesia Postprocedure Evaluation (Signed)
 Anesthesia Post Note  Patient: Alfred Thomas.  Procedure(s) Performed: BIOPSY, LYMPH NODE, INGUINAL (Right: Groin)  Patient location during evaluation: PACU Anesthesia Type: General Level of consciousness: awake and alert Pain management: pain level controlled Vital Signs Assessment: post-procedure vital signs reviewed and stable Respiratory status: spontaneous breathing, nonlabored ventilation, respiratory function stable and patient connected to nasal cannula oxygen Cardiovascular status: blood pressure returned to baseline and stable Postop Assessment: no apparent nausea or vomiting Anesthetic complications: no   No notable events documented.   Last Vitals:  Vitals:   06/29/24 1630 06/29/24 1707  BP: 129/67 130/61  Pulse: 78 72  Resp: 13 15  Temp:  (!) 36.3 C  SpO2: 97% 94%    Last Pain:  Vitals:   06/29/24 1707  TempSrc: Temporal  PainSc: 0-No pain                 Debby Mines

## 2024-07-02 ENCOUNTER — Encounter: Payer: Self-pay | Admitting: Surgery

## 2024-07-03 LAB — SURGICAL PATHOLOGY

## 2024-07-04 LAB — AEROBIC/ANAEROBIC CULTURE W GRAM STAIN (SURGICAL/DEEP WOUND): Culture: NO GROWTH

## 2024-07-11 NOTE — Progress Notes (Signed)
 Rheumatology Follow Up Note  Chief Complaint  Patient presents with  . Rheumatoid arthritis involving multiple sites with positive      Subjective:HPI   Alfred Thomas. is a 73 y.o. male is here today for follow up of rheumatoid arthritis stage 2 moderate. The patient's allergies, current medications, past family history, past medical history, past social history, past surgical history and problem list were reviewed and updated as appropriate.   He stopped taking the methotrexate, Plaquenil and prednisone for the past two months. He denies any joint pains or swelling today.   He underwent right lymph node biopsy that shows metastatic squamous cell carcinoma. He has noticed some night sweats, this is random. He has lost some weight.   He has no fever, infection, dry cough, dyspnea, nasal/oral ulcer or new skin rash.   Review of Systems:   Review of Systems  Constitutional:  Negative for fatigue.  HENT:  Positive for hearing loss. Negative for mouth sores and trouble swallowing.        Dry Mouth  Eyes:  Negative for redness.       Neg: Dry Eyes  Respiratory:  Negative for cough and shortness of breath.   Cardiovascular:  Positive for leg swelling. Negative for chest pain.  Gastrointestinal:  Positive for constipation. Negative for blood in stool, diarrhea and nausea.  Endocrine: Negative for cold intolerance and heat intolerance.  Genitourinary:  Negative for hematuria.  Musculoskeletal:        Per HPI  Skin:  Negative for color change and rash.  Neurological:  Positive for weakness. Negative for dizziness, numbness and headaches.  Hematological:  Does not bruise/bleed easily.  Psychiatric/Behavioral:  Negative for dysphoric mood and sleep disturbance. The patient is not nervous/anxious.   All other systems reviewed and are negative.  Objective:  Vitals:   07/11/24 0841  BP: 120/72  Pulse: 64  Temp: 36.5 C (97.7 F)  TempSrc: Temporal  Weight: 88.5 kg (195 lb)   Height: 193 cm (6' 4)  PainSc: 0-No pain   Length of Stiffness: All Day    GEN - Pleasant, No Apparent Distress  HEENT - normocephalic and atraumatic. Conjunctiva Clear.   Neck - supple with no adenopathy or thyromegaly.   C spine with full range of motion. Heart - regular rate and rhythm, No murmurs/gallops/rub, Nml S1S2 Lungs - clear to auscultation in all fields. Extremities - there is no cyanosis or edema. Neurological - alert and oriented.  Spine - no paraspinal tenderness; no lumbar spine tenderness  Skin - no rashes observed MSK - The following joints were examined bilaterally: Hands, Wrists, Elbows, Shoulders, Metatarsals, Ankels, Knees and Hips; they were normal apart from what is noted.    100% Fist Formation No Synovitis or Tenderness on Exam  No Dactylitis No Tender Point Gait Fluent   ______________________________________________________________________ Labs/Imaging Reviewed in EMR Cr 0.9 AST 12, ALT 08 CBC WBC 4.3, Hgb 13.6, Plt 134 (L) CK 26 TSH 4.0 LDH 141 UA: No Protein or RBCs SPEP: Elevated Total IgG, No M Spike  ESR 75 (H) CRP 20 (H) Pos: RF (14.8) Neg: ANA IFA, AntiCCP, Anti-CarP, Anti-CEP-1, Anti-Sa, 14.3.3 ETA, HLAB27 Neg: Hep B and C; Quantiferon   DEXA Scan Normal (08/2023)  CT Abd/Pelv W/WO Contrast (06/2024) Rounded enhancing right inguinal mass measuring 2.2 x 2.1 cm, suspicious  finding in patient with melanoma history. Recommend tissue sampling and/or FDG  PET scan.   1. Lymph node for lymphoma, right groin :       -  METASTATIC SQUAMOUS CELL CARCINOMA, MODERATELY DIFFERENTIATED INVOLVING       MULTIPLE MATTED LYMPH NODES.   Synovial Fluid: Inflammatory  CDAI is calculated as follows:   CDAI = SJC + TJC +PGA + EGA = 04 Where:   SJC = Swollen Joint Count TJC = Tender Joint Count PGA = Patient Global Assessment of Disease Activity EGA = Evaluator Global Assessment of Disease Activity   Interpretation <=2.8: Remission >2.8  and <=10: Low Disease Activity >10 and <=22: Moderate Disease Activity >22: High Disease Activity     Assessment and Plan     Rheumatoid arthritis stage 2 moderate: Stable -- Diagnosed 06/2023; positive RF, Symptoms started in September with bilateral shoulders pain, right wrist then left hip. Elevated ESR and CRP; multiple courses of prednisone -- Was not able to start the Enbrel due to cost despite having supplement plan. Currently he is doing well so will hold on biologic medication -- Off the Prednisone  -- Off Plaquenil 200 mg twice daily and Methotrexate 20 mg weekly and Folic Acid 1 mg/day     2. Squamous Cell Carcinoma -- New Diagnoses; unsure of primary cause -- Referral made to Oncology Evaluation     Diagnoses and all orders for this visit:  Rheumatoid arthritis involving multiple sites with positive rheumatoid factor (CMS/HHS-HCC)  Metastatic squamous cell carcinoma to lymph node (CMS/HHS-HCC) -     Ambulatory Referral to Oncology-Medical     Return in about 3 months (around 10/09/2024) for Routine Follow Up.   All new prescription medications, changes in current prescription dosages, and sample medications were discussed with the patient, including patient education, medication name, use, dosage, potential side effects, drug interactions, consequences of not using/taking, and special instructions.  Patient expressed understanding.  No barriers to adherence.   I appreciate the opportunity to participate in the care of [employer].. Please do not hesitate to contact me with any questions or concerns that may arise in regards to the patient's rheumatologic disease.    Attestation Statement:   I personally performed the service. (TP)  MAYUR LOREE BLANCH, MD

## 2024-07-11 NOTE — Progress Notes (Signed)
 Subjective:   CC: Inguinal lymphadenopathy [R59.0] POSTOP  HPI: History of Present Illness Swelling present at excision site.  No drainage, no pain.   Current Medications: has a current medication list which includes the following prescription(s): albuterol mdi (proventil, ventolin, proair) hfa, aspirin, diltiazem , repatha  sureclick, folic acid, hydroxychloroquine, and methotrexate.  Allergies:  Allergies  Allergen Reactions  . Atorvastatin Muscle Pain  . Crestor [Rosuvastatin] Other (See Comments)    Elevated liver enzymes    ROS: Pertinent positives and negatives noted in HPI    Objective:     BP (!) 166/87   Pulse 97   Ht 193 cm (6' 4)   Wt 88.5 kg (195 lb)   BMI 23.74 kg/m   Constitutional :  Alert, no distress, cooperative  Gastrointestinal: soft, non-tender; bowel sounds normal; no masses,  no organomegaly.   Musculoskeletal: Steady gait and movement  Skin: Cool and moist, incision clean, dry, intact.  Golfball size hematoma/seroma present, asymptomatic, no sign of infection.   Psychiatric: Normal affect, non-agitated, not confused       LABS:  SURGICAL PATHOLOGY SURGICAL PATHOLOGY [facility] [address] [place], [place] [address] Telephone [phone] or [phone] Fax [phone]  REPORT OF SURGICAL PATHOLOGY   Accession #: [id number] Patient Name: Alfred Thomas, Alfred Thomas Visit # : [id number]  MRN: [id number] Physician: Tye Henriette Ma 19-Dec-1950 (Age: 73) Gender: M Collected Date: 06/29/2024 Received Date: 06/29/2024  FINAL DIAGNOSIS       1. Lymph node for lymphoma, right groin :      - METASTATIC SQUAMOUS CELL CARCINOMA, MODERATELY DIFFERENTIATED INVOLVING      MULTIPLE MATTED LYMPH NODES.       Diagnosis Note : This case underwent intradepartmental consultation and Dr.      Delaine concurs with the interpretation.      DATE SIGNED OUT: 07/03/2024 ELECTRONIC SIGNATURE : Janel Md, Rexene ,  Pathologist, Electronic Signature  MICROSCOPIC DESCRIPTION  CASE COMMENTS STAINS USED IN DIAGNOSIS: H&E H&E H&E H&E H&E H&E H&E H&E H&E H&E H&E H&E H&E    CLINICAL HISTORY  SPECIMEN(S) OBTAINED 1. Lymph node for lymphoma, Right Groin  SPECIMEN COMMENTS: SPECIMEN CLINICAL INFORMATION: 1. right inguinal lymphadenopathy    Gross Description 1. Received fresh are multiple fragments of tan, cauterized, firm tissue fragments aggregating to 5.1 x 3.7 x 1.8 cm. The largest and only intact lymph node candidate is 2.2 cm, a representative portion is submitted to flow in RPMI. All remaining tissue fragments are disrupted, tan-yellow with chalky discoloration and possible lymphoid components admixed with fibroadipose tissue.      1A-75F - largest lymph node, entirely      1G-72M- all remaining tissue      mb 07-02-24        Report signed out from the following location(s) [place]. [facility] [name], [place] [address] CLIA #: [id number]  [facility] [address] [place], [place] [address] CLIA #: [id number]    RADS: N/A  Assessment:      Inguinal lymphadenopathy [R59.0] S/p excisional biopsy Path as noted above Plan:     1. Healing well from surgery. No issues.  Seroma/hematoma will flatten and resolve on its own.  All questions addressed.  Oncology referral placed by rhuem provider.  F/u prn with our office.  labs/images/medications/previous chart entries reviewed personally and relevant changes/updates noted above.

## 2024-07-11 NOTE — Patient Instructions (Signed)
At [facility], we are working on new initiatives to help you take positive steps to improving your health at home, not just when you are at the doctor's office! One of these steps is to help you set realistic health-improvement goals. Most rheumatologic diseases need medications to keep them under optimal control, so below you will find some tips and tricks to help you remember to take your medications when they are due.     Be Involved in Your Health Care: Taking Medications  When medications are taken as directed, they can greatly improve your health. But if they are not taken as instructed, they may not work. In some cases, not taking them correctly can be harmful. To help ensure your treatment remains effective and safe, understand your medications and how to take them.    Your lab results, notes and after visit summary will be available on my chart. We strongly encourage you to use this feature.   If lab results are abnormal the clinic will contact you with the appropriate steps. If the clinic does not contact you assume the results are satisfactory. You can always see you results on my chart.   If you have questions regarding your condition, please contact the clinic during office hours. You can also ask questions on my chart.     We at [facility] are grateful that you choose [place] to provide care. We strive to provide excellent compassionate care and are always looking for feedback. If you get a survey from the clinic please complete this.

## 2024-07-13 ENCOUNTER — Telehealth: Payer: Self-pay | Admitting: Oncology

## 2024-07-13 ENCOUNTER — Encounter: Payer: Self-pay | Admitting: Oncology

## 2024-07-13 ENCOUNTER — Telehealth: Payer: Self-pay

## 2024-07-13 ENCOUNTER — Inpatient Hospital Stay

## 2024-07-13 ENCOUNTER — Inpatient Hospital Stay: Attending: Oncology | Admitting: Oncology

## 2024-07-13 VITALS — BP 134/84 | HR 83 | Temp 97.2°F | Resp 18 | Wt 195.8 lb

## 2024-07-13 DIAGNOSIS — E785 Hyperlipidemia, unspecified: Secondary | ICD-10-CM | POA: Insufficient documentation

## 2024-07-13 DIAGNOSIS — C774 Secondary and unspecified malignant neoplasm of inguinal and lower limb lymph nodes: Secondary | ICD-10-CM | POA: Diagnosis not present

## 2024-07-13 DIAGNOSIS — R700 Elevated erythrocyte sedimentation rate: Secondary | ICD-10-CM | POA: Insufficient documentation

## 2024-07-13 DIAGNOSIS — R7982 Elevated C-reactive protein (CRP): Secondary | ICD-10-CM | POA: Insufficient documentation

## 2024-07-13 DIAGNOSIS — M069 Rheumatoid arthritis, unspecified: Secondary | ICD-10-CM | POA: Insufficient documentation

## 2024-07-13 DIAGNOSIS — D696 Thrombocytopenia, unspecified: Secondary | ICD-10-CM | POA: Insufficient documentation

## 2024-07-13 DIAGNOSIS — Z85820 Personal history of malignant melanoma of skin: Secondary | ICD-10-CM | POA: Diagnosis not present

## 2024-07-13 DIAGNOSIS — C801 Malignant (primary) neoplasm, unspecified: Secondary | ICD-10-CM | POA: Diagnosis not present

## 2024-07-13 DIAGNOSIS — I4891 Unspecified atrial fibrillation: Secondary | ICD-10-CM | POA: Diagnosis not present

## 2024-07-13 DIAGNOSIS — T148XXA Other injury of unspecified body region, initial encounter: Secondary | ICD-10-CM | POA: Insufficient documentation

## 2024-07-13 DIAGNOSIS — C779 Secondary and unspecified malignant neoplasm of lymph node, unspecified: Secondary | ICD-10-CM | POA: Insufficient documentation

## 2024-07-13 DIAGNOSIS — M25461 Effusion, right knee: Secondary | ICD-10-CM | POA: Diagnosis not present

## 2024-07-13 DIAGNOSIS — E119 Type 2 diabetes mellitus without complications: Secondary | ICD-10-CM | POA: Diagnosis not present

## 2024-07-13 DIAGNOSIS — L02214 Cutaneous abscess of groin: Secondary | ICD-10-CM | POA: Diagnosis not present

## 2024-07-13 NOTE — Assessment & Plan Note (Addendum)
 Squamous cell carcinoma of right inguinal lymph node s/p excisional biopsy Will obtain NGS molecular studies, add p16 IHC Obtain PET initial staging for evaluation of residual disease and evaluation of disease extent. Last colonoscopy was in July 2024 Differential diagnosis includes Anorectal SCC, cutaneous SCC of lower extremities and perineum, unknown primary etc. Check HIV

## 2024-07-13 NOTE — Telephone Encounter (Signed)
 [facility] NGS requested on [id number], right groin lymph node biopsy, collected 06/29/24.    Financial application was submitted and was denied

## 2024-07-13 NOTE — Assessment & Plan Note (Signed)
 This is a chronic condition.  Check thrombocytopenia work up at next visit.

## 2024-07-13 NOTE — Assessment & Plan Note (Signed)
 Observation. Follow up with surgery

## 2024-07-13 NOTE — Progress Notes (Signed)
 Hematology/Oncology Consult Note [phone] [phone]     REFERRING PROVIDER: Fernande Ophelia JINNY DOUGLAS, MD    CHIEF COMPLAINTS/PURPOSE OF CONSULTATION:  Squamous cell carcinoma of lymph node   ASSESSMENT & PLAN:   Squamous cell carcinoma of lymph node (HCC) Squamous cell carcinoma of right inguinal lymph node s/p excisional biopsy Will obtain NGS molecular studies, add p16 IHC Obtain PET initial staging for evaluation of residual disease and evaluation of disease extent. Last colonoscopy was in July 2024 Differential diagnosis includes Anorectal SCC, cutaneous SCC of lower extremities and perineum, unknown primary etc. Check HIV  Wound seroma Observation. Follow up with surgery  Thrombocytopenia This is a chronic condition.  Check thrombocytopenia work up at next visit.    Orders Placed This Encounter  Procedures   NM PET Image Initial (PI) Skull Base To Thigh (F-18 FDG)    Standing Status:   Future    Expected Date:   07/23/2024    Expiration Date:   07/13/2025    If indicated for the ordered procedure, I authorize the administration of a radiopharmaceutical per Radiology protocol:   Yes    Preferred imaging location?:   [facility]    All questions were answered. The patient knows to call the clinic with any problems, questions or concerns.  Zelphia Cap, MD, PhD [facility] Oncology 07/13/2024    HISTORY OF PRESENTING ILLNESS:  Alfred Thomas. 73 y.o. male presents to establish care for Squamous cell carcinoma of lymph node I have reviewed his chart and materials related to his cancer extensively and collaborated history with the patient. Summary of oncologic history is as follows:  Discussed the use of AI scribe software for clinical note transcription with the patient, who gave verbal consent to proceed.   Oncology History  Squamous cell carcinoma of lymph node (HCC)  06/21/2024 Imaging   CT abdomen pelvis w wo contrast    1. Rounded enhancing right inguinal mass measuring 2.2 x 2.1 cm, suspicious finding in patient with melanoma history. Recommend tissue sampling and/or FDG PET scan. 2. No liver metastases. 3. These results will be called to the ordering clinician or representative by the radiologist assistant, and communication documented in the pacs or clario dashboard   07/13/2024 Initial Diagnosis   Squamous cell carcinoma of lymph node  Approximately three months ago, he noticed a mass in his right groin, initially suspecting it to be a hernia. He sought an earlier appointment with his primary physician, who examined the mass and referred him for further evaluation. The mass was excised, pathology showed metastatic squamous cell carcinoma. Moderately differentiated involving multiple matted lymph nodes.   . Received fresh are multiple fragments of tan, cauterized, firm tissue fragments aggregating to 5.1 x 3.7 x 1.8 cm. The largest and only intact lymph node candidate is 2.2 cm, a representative portion is submitted to flow in RPMI.  All remaining tissue fragments are disrupted, tan-yellow with chalky discoloration and possible lymphoid components admixed with fibroadipose tissue.    Today patient presents to establish care.  Post-surgery, he has experienced persistent swelling in the area, which has not resolved.  He has a history of melanoma, with previous lesions on his shoulder and back, treated at [facility]. Additionally, he has a history of squamous cell carcinoma on his right forearm, which was excised.  He is active, plays golf, and walks regularly. He has never smoked. No rectal bleeding or discomfort. Reports swelling in the right knee.  MEDICAL HISTORY:  Past Medical History:  Diagnosis Date   Allergy    Atrial fibrillation (HCC)    Cancer (HCC)    melanoma skin cancer shoulder   Coronary artery disease involving native coronary artery of native heart without angina  pectoris    Diabetes mellitus without complication (HCC)    Diverticulitis    Dyslipidemia    H/O echocardiogram    History of melanoma    Hyperglycemia    Hyperlipidemia    Long-term use of immunosuppressant medication    Mass of right inguinal region    PAC (premature atrial contraction)    Palpitations    Rheumatoid arthritis involving multiple sites with positive rheumatoid factor (HCC)    Thrombocytopenia     SURGICAL HISTORY: Past Surgical History:  Procedure Laterality Date   COLONOSCOPY     x2; last 2016   COLONOSCOPY W/ POLYPECTOMY  03/01/2023   Alfred Thomas at [facility]   POLYPECTOMY     SENTINEL NODE BIOPSY Right 06/29/2024   Procedure: BIOPSY, LYMPH NODE, INGUINAL;  Surgeon: Tye Millet, DO;  Location: [facility] ORS;  Service: General;  Laterality: Right;   TONSILLECTOMY     as a child   WISDOM TOOTH EXTRACTION      SOCIAL HISTORY: Social History   Socioeconomic History   Marital status: Married    Spouse name: Not on file   Number of children: Not on file   Years of education: Not on file   Highest education level: Not on file  Occupational History   Not on file  Tobacco Use   Smoking status: Never   Smokeless tobacco: Never  Vaping Use   Vaping status: Never Used  Substance and Sexual Activity   Alcohol use: Yes    Comment: socially/rarely   Drug use: No    Comment: prescription drugs   Sexual activity: Not on file  Other Topics Concern   Not on file  Social History Narrative   Not on file   Social Drivers of Health   Financial Resource Strain: Low Risk  (06/26/2024)   Received from [facility]   Overall Financial Resource Strain (CARDIA)    Difficulty of Paying Living Expenses: Not hard at all  Food Insecurity: No Food Insecurity (07/13/2024)   Hunger Vital Sign    Worried About Running Out of Food in the Last Year: Never true    Ran Out of Food in the Last Year: Never true  Transportation Needs: No Transportation Needs  (07/13/2024)   PRAPARE - Administrator, Civil Service (Medical): No    Lack of Transportation (Non-Medical): No  Physical Activity: Not on file  Stress: Not on file  Social Connections: Not on file  Intimate Partner Violence: Not At Risk (07/13/2024)   Humiliation, Afraid, Rape, and Kick questionnaire    Fear of Current or Ex-Partner: No    Emotionally Abused: No    Physically Abused: No    Sexually Abused: No    FAMILY HISTORY: Family History  Problem Relation Age of Onset   Coronary artery disease Mother    Heart attack Mother    Heart disease Mother    Ovarian cancer Mother    Heart disease Father    Emphysema Father    Colon cancer Neg Hx    Colon polyps Neg Hx    Esophageal cancer Neg Hx    Rectal cancer Neg Hx    Stomach cancer Neg Hx     ALLERGIES:  is allergic  to rosuvastatin and atorvastatin.  MEDICATIONS:  Current Outpatient Medications  Medication Sig Dispense Refill   acetaminophen  (TYLENOL ) 325 MG tablet Take 2 tablets (650 mg total) by mouth every 8 (eight) hours as needed for mild pain (pain score 1-3). 40 tablet 0   diltiazem  (CARDIZEM  CD) 240 MG 24 hr capsule Take 240 mg by mouth in the morning.     Evolocumab  (REPATHA  SURECLICK) 140 MG/ML SOAJ INJECT 1 PEN. INTO THE SKIN EVERY 14 (FOURTEEN) DAYS. 6 mL 0   aspirin EC 81 MG tablet Take 81 mg by mouth in the morning. (Patient not taking: Reported on 07/13/2024)     oxyCODONE -acetaminophen  (PERCOCET) 5-325 MG tablet Take 1 tablet by mouth every 8 (eight) hours as needed for severe pain (pain score 7-10). (Patient not taking: Reported on 07/13/2024) 6 tablet 0   No current facility-administered medications for this visit.    Review of Systems  Constitutional:  Negative for appetite change, chills, fatigue and fever.  HENT:   Negative for hearing loss and voice change.   Eyes:  Negative for eye problems.  Respiratory:  Negative for chest tightness and cough.   Cardiovascular:  Negative for chest  pain.  Gastrointestinal:  Negative for abdominal distention, abdominal pain and blood in stool.  Endocrine: Negative for hot flashes.  Genitourinary:  Negative for difficulty urinating and frequency.        Right groin mass/swelling  Musculoskeletal:  Negative for arthralgias.  Skin:  Negative for itching and rash.  Neurological:  Negative for extremity weakness.  Hematological:  Negative for adenopathy.  Psychiatric/Behavioral:  Negative for confusion.      PHYSICAL EXAMINATION: ECOG PERFORMANCE STATUS: 0 - Asymptomatic  Vitals:   07/13/24 1126  BP: 134/84  Pulse: 83  Resp: 18  Temp: (!) 97.2 F (36.2 C)   Filed Weights   07/13/24 1126  Weight: 195 lb 12.8 oz (88.8 kg)    Physical Exam Constitutional:      General: He is not in acute distress.    Appearance: He is not diaphoretic.  HENT:     Head: Normocephalic and atraumatic.  Eyes:     General: No scleral icterus. Cardiovascular:     Rate and Rhythm: Normal rate and regular rhythm.  Pulmonary:     Effort: Pulmonary effort is normal. No respiratory distress.     Breath sounds: No wheezing.  Abdominal:     General: There is no distension.     Palpations: Abdomen is soft.     Tenderness: There is no abdominal tenderness.  Genitourinary:    Comments: Right groin palpable cystic mass Musculoskeletal:        General: Normal range of motion.     Cervical back: Normal range of motion and neck supple.  Skin:    General: Skin is warm and dry.     Findings: No erythema.  Neurological:     Mental Status: He is alert and oriented to person, place, and time. Mental status is at baseline.     Motor: No abnormal muscle tone.  Psychiatric:        Mood and Affect: Mood and affect normal.      LABORATORY DATA:  I have reviewed the data as listed    Latest Ref Rng & Units 11/18/2021   12:39 PM 11/13/2021    7:52 AM 08/13/2015    4:02 PM  CBC  WBC 3.4 - 10.8 x10E3/uL 7.6  CANCELED  6.8   Hemoglobin 13.0 - 17.7 g/dL  14.6  CANCELED  15.0   Hematocrit 37.5 - 51.0 % 42.7  CANCELED  45.2   Platelets 150 - 450 x10E3/uL 151  CANCELED  136       Latest Ref Rng & Units 11/13/2021    7:52 AM 10/03/2019    8:10 AM 08/13/2015    4:02 PM  CMP  Glucose 70 - 99 mg/dL 885   890   BUN 8 - 27 mg/dL 11   10   Creatinine 9.23 - 1.27 mg/dL 8.96   9.10   Sodium 865 - 144 mmol/L 144   138   Potassium 3.5 - 5.2 mmol/L 4.7   3.8   Chloride 96 - 106 mmol/L 104   106   CO2 20 - 29 mmol/L 25   22   Calcium 8.6 - 10.2 mg/dL 9.0   8.9   Total Protein 6.0 - 8.5 g/dL 7.6  7.5    Total Bilirubin 0.0 - 1.2 mg/dL 0.6  0.5    Alkaline Phos 44 - 121 IU/L 76  77    AST 0 - 40 IU/L 20  28    ALT 0 - 44 IU/L 13  24       RADIOGRAPHIC STUDIES: I have personally reviewed the radiological images as listed and agreed with the findings in the report. CT ABDOMEN PELVIS W WO CONTRAST Result Date: 06/21/2024 EXAM: CT ABDOMEN AND PELVIS WITHOUT AND WITH CONTRAST 06/21/2024 02:21:49 PM TECHNIQUE: CT of the abdomen and pelvis was performed without and with the administration of 100 mL of iohexol  (OMNIPAQUE ) 300 MG/ML solution intravenous contrast. Multiplanar reformatted images are provided for review. Automated exposure control, iterative reconstruction, and/or weight-based adjustment of the mA/kV was utilized to reduce the radiation dose to as low as reasonably achievable. COMPARISON: CT 02/17/2024 CLINICAL HISTORY: right inguinal mass history of melanoma. * Tracking Code: BO * FINDINGS: LOWER CHEST: No acute abnormality. LIVER: No liver metastasis. GALLBLADDER AND BILE DUCTS: Gallbladder is unremarkable. No biliary ductal dilatation. SPLEEN: No acute abnormality. PANCREAS: No acute abnormality. ADRENAL GLANDS: No acute abnormality. KIDNEYS, URETERS AND BLADDER: No stones in the kidneys or ureters. No hydronephrosis. No perinephric or periureteral stranding. Urinary bladder is unremarkable. GI AND BOWEL: Stomach demonstrates no acute abnormality.  There is no bowel obstruction. PERITONEUM AND RETROPERITONEUM: No ascites. No free air. No intraperitoneal nodularity. VASCULATURE: Aorta is normal in caliber. LYMPH NODES: No lymphadenopathy. REPRODUCTIVE ORGANS: No acute abnormality. BONES AND SOFT TISSUES: Rounded enhancing mass in the right inguinal space measuring 2.2 x 2.1 cm on image 84 series 7. This mass approaches the skin surface and is adjacent to the skin marker. Paracalcified nodule in the left ventral peroneal space is unchanged. Tube. No acute osseous abnormality. IMPRESSION: 1. Rounded enhancing right inguinal mass measuring 2.2 x 2.1 cm, suspicious finding in patient with melanoma history. Recommend tissue sampling and/or FDG PET scan. 2. No liver metastases. 3. These results will be called to the ordering clinician or representative by the radiologist assistant, and communication documented in the pacs or clario dashboard Electronically signed by: Alfred Boxer MD 06/21/2024 02:41 PM EST RP Workstation: [id number]

## 2024-07-13 NOTE — Telephone Encounter (Signed)
 Pt was here for new pt appt today. LOS stated PET around 12/15 and MD 1 week after PET. PET scan is scheduled and pt stated we are not scheduling the MD appt until after his case is presented by MD to the board. Pt stated that after his case is presented and there is an updated plan then we will call him and schedule appts, that was how he understood MD.

## 2024-07-16 NOTE — Telephone Encounter (Signed)
 Please schedule MD for 12/19, this will be after PET and TB.

## 2024-07-16 NOTE — Addendum Note (Signed)
 Addended by: BABARA CALL on: 07/16/2024 09:26 AM   Modules accepted: Orders

## 2024-07-20 ENCOUNTER — Other Ambulatory Visit

## 2024-07-25 ENCOUNTER — Ambulatory Visit: Admission: RE | Admit: 2024-07-25 | Discharge: 2024-07-25 | Attending: Oncology

## 2024-07-25 DIAGNOSIS — Z85820 Personal history of malignant melanoma of skin: Secondary | ICD-10-CM | POA: Diagnosis not present

## 2024-07-25 DIAGNOSIS — C779 Secondary and unspecified malignant neoplasm of lymph node, unspecified: Secondary | ICD-10-CM | POA: Diagnosis present

## 2024-07-25 LAB — GLUCOSE, CAPILLARY: Glucose-Capillary: 114 mg/dL — ABNORMAL HIGH (ref 70–99)

## 2024-07-25 MED ORDER — FLUDEOXYGLUCOSE F - 18 (FDG) INJECTION
10.4500 | Freq: Once | INTRAVENOUS | Status: AC | PRN
  Administered 2024-07-25: 11:00:00 10.45 via INTRAVENOUS

## 2024-07-26 ENCOUNTER — Inpatient Hospital Stay

## 2024-07-27 ENCOUNTER — Inpatient Hospital Stay: Admitting: Oncology

## 2024-07-27 ENCOUNTER — Inpatient Hospital Stay

## 2024-07-27 ENCOUNTER — Encounter: Payer: Self-pay | Admitting: Oncology

## 2024-07-27 VITALS — BP 124/88 | HR 95 | Temp 97.7°F | Resp 18 | Wt 192.8 lb

## 2024-07-27 DIAGNOSIS — D696 Thrombocytopenia, unspecified: Secondary | ICD-10-CM

## 2024-07-27 DIAGNOSIS — L02214 Cutaneous abscess of groin: Secondary | ICD-10-CM | POA: Diagnosis not present

## 2024-07-27 DIAGNOSIS — C779 Secondary and unspecified malignant neoplasm of lymph node, unspecified: Secondary | ICD-10-CM

## 2024-07-27 DIAGNOSIS — M069 Rheumatoid arthritis, unspecified: Secondary | ICD-10-CM | POA: Insufficient documentation

## 2024-07-27 DIAGNOSIS — C801 Malignant (primary) neoplasm, unspecified: Secondary | ICD-10-CM | POA: Diagnosis not present

## 2024-07-27 LAB — COMPREHENSIVE METABOLIC PANEL WITH GFR
ALT: 8 U/L (ref 0–44)
AST: 17 U/L (ref 15–41)
Albumin: 4.1 g/dL (ref 3.5–5.0)
Alkaline Phosphatase: 78 U/L (ref 38–126)
Anion gap: 13 (ref 5–15)
BUN: 11 mg/dL (ref 8–23)
CO2: 23 mmol/L (ref 22–32)
Calcium: 9.5 mg/dL (ref 8.9–10.3)
Chloride: 100 mmol/L (ref 98–111)
Creatinine, Ser: 0.97 mg/dL (ref 0.61–1.24)
GFR, Estimated: >60 mL/min
Glucose, Bld: 117 mg/dL — ABNORMAL HIGH (ref 70–99)
Potassium: 4.1 mmol/L (ref 3.5–5.1)
Sodium: 137 mmol/L (ref 135–145)
Total Bilirubin: 0.6 mg/dL (ref 0.0–1.2)
Total Protein: 8.6 g/dL — ABNORMAL HIGH (ref 6.5–8.1)

## 2024-07-27 LAB — CBC WITH DIFFERENTIAL/PLATELET
Abs Immature Granulocytes: 0.06 K/uL (ref 0.00–0.07)
Basophils Absolute: 0 K/uL (ref 0.0–0.1)
Basophils Relative: 0 %
Eosinophils Absolute: 0 K/uL (ref 0.0–0.5)
Eosinophils Relative: 0 %
HCT: 37.9 % — ABNORMAL LOW (ref 39.0–52.0)
Hemoglobin: 12.3 g/dL — ABNORMAL LOW (ref 13.0–17.0)
Immature Granulocytes: 1 %
Lymphocytes Relative: 25 %
Lymphs Abs: 1.4 K/uL (ref 0.7–4.0)
MCH: 26.1 pg (ref 26.0–34.0)
MCHC: 32.5 g/dL (ref 30.0–36.0)
MCV: 80.5 fL (ref 80.0–100.0)
Monocytes Absolute: 1 K/uL (ref 0.1–1.0)
Monocytes Relative: 18 %
Neutro Abs: 3 K/uL (ref 1.7–7.7)
Neutrophils Relative %: 56 %
Platelets: 153 K/uL (ref 150–400)
RBC: 4.71 MIL/uL (ref 4.22–5.81)
RDW: 14.5 % (ref 11.5–15.5)
WBC: 5.5 K/uL (ref 4.0–10.5)
nRBC: 0 % (ref 0.0–0.2)

## 2024-07-27 LAB — HEPATITIS PANEL, ACUTE
HCV Ab: NONREACTIVE
Hep A IgM: NONREACTIVE
Hep B C IgM: NONREACTIVE
Hepatitis B Surface Ag: NONREACTIVE

## 2024-07-27 LAB — HIV ANTIBODY (ROUTINE TESTING W REFLEX): HIV Screen 4th Generation wRfx: NONREACTIVE

## 2024-07-27 NOTE — Progress Notes (Unsigned)
 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  In addition to hypermetabolic right groin, he has increased metabolic activity in the right external and common iliac nodal area, and mild activity in the retroperitoneal nodes. His case was discussed at tumor board. The additional iliac nodal area and retroperitoneal area hypermetabolism could be secondary to current infection versus cancer involvement.  I have discussed case with patient's surgeon Dr. Tye.  Anorectal area was examined by him and there were no suspicious lesions.  His last colonoscopy was in July 2024.  Radiology oncology will not offer radiation until acute inflammation/infection has resolved.  Recommend patient to get official dermatology evaluation of right lower extremity.  No clinical lesions were identified.  I discussed about option of systemic chemotherapy adjuvantly.  I will wait until right groin abscess has been drained and treated.  Also given that he has a history of rheumatoid arthritis, I would like to further discuss the case with his rheumatology to see if immunotherapy could be attempted.  Thrombocytopenia This is a chronic condition.  Monitor counts  Abscess of right groin I discussed with Dr. Tye.  Patient will be seen by Dr. Tye for I&D and antibiotic treatments.  Rheumatoid arthritis (HCC) Follows up with Dr. Tobie. Diagnosed 06/2023; positive RF, Symptoms started in September with bilateral shoulders pain, right wrist then left hip. Elevated ESR and CRP; multiple courses of prednisone currently he is off any medication and  clinically doing well.  I will reach out to Dr. Tobie to see if immunotherapy is an option for him.    Orders Placed This Encounter  Procedures   CBC with Differential/Platelet    Standing Status:   Future    Number of Occurrences:   1    Expected Date:   07/27/2024    Expiration Date:   10/25/2024   Comprehensive metabolic panel with GFR    Standing Status:   Future    Number of Occurrences:   1    Expected Date:   07/27/2024    Expiration Date:   10/25/2024   HIV Antibody (routine testing w rflx)    Standing Status:   Future    Number of Occurrences:   1    Expected Date:   07/27/2024    Expiration Date:   10/25/2024   Hepatitis panel, acute    Standing Status:   Future    Number of Occurrences:   1    Expected Date:   07/27/2024    Expiration Date:   10/25/2024   Follow-up to be determined. All questions were answered. The patient knows to call the clinic with any problems, questions or concerns.  Zelphia Cap, MD, PhD [facility] Hematology Oncology 07/27/2024    HISTORY OF PRESENTING ILLNESS:  Alfred Thomas. 73 y.o. male presents to establish care for Squamous cell carcinoma of lymph node I have reviewed his chart and materials related to his cancer extensively and collaborated history with the patient. Summary of oncologic history is as follows:  Discussed the use of AI scribe software for clinical note transcription with the patient, who gave verbal consent to proceed.  Oncology History  Squamous cell carcinoma of lymph node (HCC)  06/21/2024 Imaging   CT abdomen pelvis w wo contrast   1. Rounded enhancing right inguinal mass measuring 2.2 x 2.1 cm, suspicious finding in patient with melanoma history. Recommend tissue sampling and/or FDG PET scan. 2. No liver metastases. 3. These results will be called to the ordering clinician or representative by the radiologist assistant, and communication documented in the pacs or clario dashboard   07/13/2024 Initial  Diagnosis   Squamous cell carcinoma of lymph node  Approximately three months ago, he noticed a mass in his right groin, initially suspecting it to be a hernia. He sought an earlier appointment with his primary physician, who examined the mass and referred him for further evaluation. The mass was excised, pathology showed metastatic squamous cell carcinoma. Moderately differentiated involving multiple matted lymph nodes.   . Received fresh are multiple fragments of tan, cauterized, firm tissue fragments aggregating to 5.1 x 3.7 x 1.8 cm. The largest and only intact lymph node candidate is 2.2 cm, a representative portion is submitted to flow in RPMI.  All remaining tissue fragments are disrupted, tan-yellow with chalky discoloration and possible lymphoid components admixed with fibroadipose tissue.   07/25/2024 Imaging   PET scan showed  1. Thick-rimmed fluid collection in the right groin measuring 3.9 x 4.6 cm with intensely hypermetabolic rim, concerning for abscess or infected postoperative collection. 2. Hypermetabolic right external and common iliac nodal disease with extension into the right anterior obturator space, with additional small hypermetabolic retroperitoneal nodes. 3. Small minimally metabolic axial lymph nodes are favored unrelated to the right iliac adenopathy. 4. No primary malignancy identified.    He has a history of melanoma, with previous lesions on his shoulder and back, treated at [facility]. Additionally, he has a history of squamous cell carcinoma on his right forearm, which was excised.  He is active, plays golf, and walks regularly. He has never smoked. No rectal bleeding or discomfort. Reports swelling in the right knee.   INTERVAL HISTORY Dennys Guin. is a 73 y.o. male who has above history reviewed by me today presents for follow up visit for [facility] involving right inguinal nodes.   Discussed the use of AI scribe software for clinical note  transcription with the patient, who gave verbal consent to proceed.   Since surgery, the excision site has become progressively erythematous and enlarged daily, without associated pain and no need for analgesics. He has not received antibiotics postoperatively and is concerned about possible infection.   His rheumatoid arthritis, previously debilitating, was managed with prednisone, methotrexate, folic acid, and hydroxychloroquine for approximately one year. He tapered off prednisone to 5 mg and discontinued all immunosuppressive medications two months ago. Currently, his arthritis symptoms are controlled and he denies joint pain.  MEDICAL HISTORY:  Past Medical History:  Diagnosis Date   Allergy    Atrial fibrillation (HCC)    Cancer (HCC)    melanoma skin cancer shoulder   Coronary artery disease involving native coronary artery of native heart without angina pectoris    Diabetes mellitus without complication (HCC)    Diverticulitis    Dyslipidemia    H/O echocardiogram    History of melanoma    Hyperglycemia    Hyperlipidemia    Long-term use of immunosuppressant medication    Mass of right inguinal region    PAC (premature atrial contraction)    Palpitations    Rheumatoid arthritis involving multiple sites with positive rheumatoid  factor (HCC)    Thrombocytopenia     SURGICAL HISTORY: Past Surgical History:  Procedure Laterality Date   COLONOSCOPY     x2; last 2016   COLONOSCOPY W/ POLYPECTOMY  03/01/2023   Norleen Kiang at [facility]   POLYPECTOMY     SENTINEL NODE BIOPSY Right 06/29/2024   Procedure: BIOPSY, LYMPH NODE, INGUINAL;  Surgeon: Tye Millet, DO;  Location: [facility] ORS;  Service: General;  Laterality: Right;   TONSILLECTOMY     as a child   WISDOM TOOTH EXTRACTION      SOCIAL HISTORY: Social History   Socioeconomic History   Marital status: Married    Spouse name: Not on file   Number of children: Not on file   Years of education: Not on file   Highest  education level: Not on file  Occupational History   Not on file  Tobacco Use   Smoking status: Never   Smokeless tobacco: Never  Vaping Use   Vaping status: Never Used  Substance and Sexual Activity   Alcohol use: Yes    Comment: socially/rarely   Drug use: No    Comment: prescription drugs   Sexual activity: Not on file  Other Topics Concern   Not on file  Social History Narrative   Not on file   Social Drivers of Health   Tobacco Use: Low Risk (07/27/2024)   Patient History    Smoking Tobacco Use: Never    Smokeless Tobacco Use: Never    Passive Exposure: Not on file  Financial Resource Strain: Low Risk  (06/26/2024)   Received from [facility]   Overall Financial Resource Strain (CARDIA)    Difficulty of Paying Living Expenses: Not hard at all  Food Insecurity: No Food Insecurity (07/13/2024)   Epic    Worried About Radiation Protection Practitioner of Food in the Last Year: Never true    Ran Out of Food in the Last Year: Never true  Transportation Needs: No Transportation Needs (07/13/2024)   Epic    Lack of Transportation (Medical): No    Lack of Transportation (Non-Medical): No  Physical Activity: Not on file  Stress: Not on file  Social Connections: Not on file  Intimate Partner Violence: Not At Risk (07/13/2024)   Epic    Fear of Current or Ex-Partner: No    Emotionally Abused: No    Physically Abused: No    Sexually Abused: No  Depression (PHQ2-9): Low Risk (07/13/2024)   Depression (PHQ2-9)    PHQ-2 Score: 0  Alcohol Screen: Not on file  Housing: Low Risk  (07/19/2024)   Received from [facility]   Epic    In the last 12 months, was there a time when you were not able to pay the mortgage or rent on time?: No    In the past 12 months, how many times have you moved where you were living?: 0    At any time in the past 12 months, were you homeless or living in a shelter (including now)?: No  Utilities: Not At Risk (07/13/2024)   Epic     Threatened with loss of utilities: No  Health Literacy: Not on file    FAMILY HISTORY: Family History  Problem Relation Age of Onset   Coronary artery disease Mother    Heart attack Mother    Heart disease Mother    Ovarian cancer Mother    Heart disease Father    Emphysema Father    Colon cancer  Neg Hx    Colon polyps Neg Hx    Esophageal cancer Neg Hx    Rectal cancer Neg Hx    Stomach cancer Neg Hx     ALLERGIES:  is allergic to rosuvastatin and atorvastatin.  MEDICATIONS:  Current Outpatient Medications  Medication Sig Dispense Refill   acetaminophen  (TYLENOL ) 325 MG tablet Take 2 tablets (650 mg total) by mouth every 8 (eight) hours as needed for mild pain (pain score 1-3). 40 tablet 0   aspirin EC 81 MG tablet Take 81 mg by mouth in the morning. (Patient not taking: Reported on 07/13/2024)     diltiazem  (CARDIZEM  CD) 240 MG 24 hr capsule Take 240 mg by mouth in the morning.     Evolocumab  (REPATHA  SURECLICK) 140 MG/ML SOAJ INJECT 1 PEN. INTO THE SKIN EVERY 14 (FOURTEEN) DAYS. 6 mL 0   oxyCODONE -acetaminophen  (PERCOCET) 5-325 MG tablet Take 1 tablet by mouth every 8 (eight) hours as needed for severe pain (pain score 7-10). (Patient not taking: Reported on 07/13/2024) 6 tablet 0   No current facility-administered medications for this visit.    Review of Systems  Constitutional:  Negative for appetite change, chills, fatigue and fever.  HENT:   Negative for hearing loss and voice change.   Eyes:  Negative for eye problems.  Respiratory:  Negative for chest tightness and cough.   Cardiovascular:  Negative for chest pain.  Gastrointestinal:  Negative for abdominal distention, abdominal pain and blood in stool.  Endocrine: Negative for hot flashes.  Genitourinary:  Negative for difficulty urinating and frequency.        Right groin mass/swelling  Musculoskeletal:  Negative for arthralgias.  Skin:  Negative for itching and rash.  Neurological:  Negative for extremity  weakness.  Hematological:  Negative for adenopathy.  Psychiatric/Behavioral:  Negative for confusion.      PHYSICAL EXAMINATION: ECOG PERFORMANCE STATUS: 0 - Asymptomatic  Vitals:   07/27/24 0902  BP: 124/88  Pulse: 95  Resp: 18  Temp: 97.7 F (36.5 C)   Filed Weights   07/27/24 0902  Weight: 192 lb 12.8 oz (87.5 kg)    Physical Exam Constitutional:      General: He is not in acute distress.    Appearance: He is not diaphoretic.  HENT:     Head: Normocephalic and atraumatic.  Eyes:     General: No scleral icterus. Cardiovascular:     Rate and Rhythm: Normal rate and regular rhythm.  Pulmonary:     Effort: Pulmonary effort is normal. No respiratory distress.     Breath sounds: No wheezing.  Abdominal:     General: There is no distension.     Palpations: Abdomen is soft.     Tenderness: There is no abdominal tenderness.  Genitourinary:    Comments: Right groin palpable cystic mass Musculoskeletal:        General: Normal range of motion.     Cervical back: Normal range of motion and neck supple.  Skin:    General: Skin is warm and dry.     Findings: No erythema.  Neurological:     Mental Status: He is alert and oriented to person, place, and time. Mental status is at baseline.     Motor: No abnormal muscle tone.  Psychiatric:        Mood and Affect: Mood and affect normal.      LABORATORY DATA:  I have reviewed the data as listed    Latest Ref Rng & Units  07/27/2024   10:01 AM 11/18/2021   12:39 PM 11/13/2021    7:52 AM  CBC  WBC 4.0 - 10.5 K/uL 5.5  7.6  CANCELED   Hemoglobin 13.0 - 17.0 g/dL 87.6  85.3  CANCELED   Hematocrit 39.0 - 52.0 % 37.9  42.7  CANCELED   Platelets 150 - 400 K/uL 153  151  CANCELED       Latest Ref Rng & Units 07/27/2024   10:01 AM 11/13/2021    7:52 AM 10/03/2019    8:10 AM  CMP  Glucose 70 - 99 mg/dL 882  885    BUN 8 - 23 mg/dL 11  11    Creatinine 9.38 - 1.24 mg/dL 9.02  8.96    Sodium 864 - 145 mmol/L 137  144     Potassium 3.5 - 5.1 mmol/L 4.1  4.7    Chloride 98 - 111 mmol/L 100  104    CO2 22 - 32 mmol/L 23  25    Calcium 8.9 - 10.3 mg/dL 9.5  9.0    Total Protein 6.5 - 8.1 g/dL 8.6  7.6  7.5   Total Bilirubin 0.0 - 1.2 mg/dL 0.6  0.6  0.5   Alkaline Phos 38 - 126 U/L 78  76  77   AST 15 - 41 U/L 17  20  28    ALT 0 - 44 U/L 8  13  24       RADIOGRAPHIC STUDIES: I have personally reviewed the radiological images as listed and agreed with the findings in the report. NM PET Image Initial (PI) Skull Base To Thigh (F-18 FDG) Result Date: 07/25/2024 EXAM: PET AND CT SKULL BASE TO MID THIGH 07/25/2024 12:28:34 PM TECHNIQUE: RADIOPHARMACEUTICAL: 10.45 mCi F-18 FDG Uptake time 60 minutes. Glucose level 114 mg/dl. 10.45 mCi F-18 FDG was given by left AC IV at 11:09 am by SDH. PET imaging was acquired from the base of the skull to the mid thighs. Non-contrast enhanced computed tomography was obtained for attenuation correction and anatomic localization. COMPARISON: None available. CLINICAL HISTORY: Squamous cell carcinoma of lymph node. History of right forearm melanoma 3 years ago. NPO, No DM. 06/29/24 Biopsy of lymph node for lymphoma, right groin. Patient reports increasing redness and swelling in right groin post biopsy. FINDINGS: HEAD AND NECK: No metabolically active cervical lymphadenopathy. CHEST: No metabolically active pulmonary nodules. Hypermetabolic activity associated with small right axillary lymph node measuring max 3.0. This node is very small positioned beneath the pectoralis muscle measuring 5 mm on image 43.6. Similar Small lymph nodes on the left. Favor of these axillary nodes unrelated to the right groin iliac adenopathy. ABDOMEN AND PELVIS: There are hypermetabolic right external iliac nodes measuring 12 mm on image 153 with SUV max equal to 8.2. Hypermetabolic nodal activity extends into the anterior obturator space on the right. A second hypermetabolic right external iliac node measures 10 mm  slightly more caudad with SUV max equals 6.1 on image 146. There is a hypermetabolic right common iliac node with SUV max of 7.2 on image 132. SABRAThere are several very small hypermetabolic retroperitoneal nodes positioned between the IVC and the aorta at the level of the kidneys. For example, a small node measuring 6 mm on image 108. Physiologic activity within the gastrointestinal and genitourinary systems. No abnormal activity in the liver. BONES AND SOFT TISSUE: No metabolically active aggressive osseous lesion. There is a well circumscribed fluid collection with thickened rim in the right groin measuring 3.9 x  4.6 cm. This collection has an intensely hypermetabolic rim with SUV max equal to 8.7. IMPRESSION: 1. Thick-rimmed fluid collection in the right groin measuring 3.9 x 4.6 cm with intensely hypermetabolic rim, concerning for abscess or infected postoperative collection. 2. Hypermetabolic right external and common iliac nodal disease with extension into the right anterior obturator space, with additional small hypermetabolic retroperitoneal nodes. 3. Small minimally metabolic axial lymph nodes are favored unrelated to the right iliac adenopathy. 4. No primary malignancy identified. Electronically signed by: Norleen Boxer MD 07/25/2024 01:00 PM EST RP Workstation: [id number]    "

## 2024-07-27 NOTE — Assessment & Plan Note (Addendum)
 Squamous cell carcinoma of right inguinal lymph node s/p excisional biopsy Awaiting NGS molecular studies PET did not reveal primary sites.  In addition to hypermetabolic right groin, he has increased metabolic activity in the right external and common iliac nodal area, and mild activity in the retroperitoneal nodes. His case was discussed at tumor board. The additional iliac nodal area and retroperitoneal area hypermetabolism could be secondary to current infection versus cancer involvement.  I have discussed case with patient's surgeon Dr. Tye.  Anorectal area was examined by him and there were no suspicious lesions.  His last colonoscopy was in July 2024.  Radiology oncology will not offer radiation until acute inflammation/infection has resolved.  Recommend patient to get official dermatology evaluation of right lower extremity.  No clinical lesions were identified.  I discussed about option of systemic chemotherapy adjuvantly.  I will wait until right groin abscess has been drained and treated.  Also given that he has a history of rheumatoid arthritis, I would like to further discuss the case with his rheumatology to see if immunotherapy could be attempted.

## 2024-07-27 NOTE — Assessment & Plan Note (Signed)
 I discussed with Dr. Tye.  Patient will be seen by Dr. Tye for I&D and antibiotic treatments.

## 2024-07-27 NOTE — Assessment & Plan Note (Signed)
 Follows up with Dr. Tobie. Diagnosed 06/2023; positive RF, Symptoms started in September with bilateral shoulders pain, right wrist then left hip. Elevated ESR and CRP; multiple courses of prednisone currently he is off any medication and clinically doing well.  I will reach out to Dr. Tobie to see if immunotherapy is an option for him.

## 2024-07-27 NOTE — Progress Notes (Unsigned)
 Pt here for follow up. Pt reports surgical site redness and swelling.

## 2024-07-27 NOTE — Assessment & Plan Note (Addendum)
 This is a chronic condition.  Monitor counts

## 2024-07-28 ENCOUNTER — Ambulatory Visit: Payer: Self-pay | Admitting: Oncology

## 2024-07-30 NOTE — Progress Notes (Signed)
 Called and left message with Megan at [facility], for Dr. Tobie to call Dr. Babara.

## 2024-08-01 NOTE — Telephone Encounter (Signed)
 Results received and sent to scan. Copy given to provider.

## 2024-08-06 ENCOUNTER — Encounter: Payer: Self-pay | Admitting: Oncology

## 2024-08-14 ENCOUNTER — Telehealth: Payer: Self-pay | Admitting: *Deleted

## 2024-08-14 NOTE — Telephone Encounter (Signed)
 Caller verified using pt's full name and dob prior to discussing PHI    Patient called triage to request a follow-up apt with Dr. Babara. He had his procedure on 12/22 to drain the abscess in his groin. His follow-up with Dr. Tye was on 12/30. Dr. Tye recommended follow-up with oncology.  Please reach out patient to further discuss follow-up timing with Dr. Babara.

## 2024-08-15 ENCOUNTER — Inpatient Hospital Stay: Attending: Oncology | Admitting: Hospice and Palliative Medicine

## 2024-08-15 DIAGNOSIS — C779 Secondary and unspecified malignant neoplasm of lymph node, unspecified: Secondary | ICD-10-CM

## 2024-08-15 NOTE — Progress Notes (Signed)
 Multidisciplinary Oncology Council Documentation  Alfred Thomas. was presented by our [facility] on 08/15/2024, which included representatives from:  Palliative Care Dietitian  Physical/Occupational Therapist Nurse Navigator Genetics Social work Survivorship RN Financial Navigator Research RN   [employer] currently presents with history of SCC  We reviewed previous medical and familial history, history of present illness, and recent lab results along with all available histopathologic and imaging studies. The MOC considered available treatment options and made the following recommendations/referrals:  None currently   The MOC is a meeting of clinicians from various specialty areas who evaluate and discuss patients for whom a multidisciplinary approach is being considered. Final determinations in the plan of care are those of the provider(s).   Today's extended care, comprehensive team conference, Alfred Thomas was not present for the discussion and was not examined.

## 2024-08-15 NOTE — Telephone Encounter (Signed)
 Per Dr. Babara Talked to rheumatologist Dr. Tobie will is okay with patient going on immunotherapy.  I had a phone discussion with patient.  He would like to schedule another follow-up MD visit with me with his wife/daughter for further discussion prior to proceeding.  Please arrange.  Thank you    Please contact Alfred Thomas to set up MD appt (next week looks best with Dr. Layvonne schedule). Please remind patient that only one person is allowed during visit and the other person can be in visit via phone.

## 2024-08-21 ENCOUNTER — Encounter: Payer: Self-pay | Admitting: Oncology

## 2024-08-21 ENCOUNTER — Inpatient Hospital Stay: Admitting: Oncology

## 2024-08-21 VITALS — BP 138/79 | HR 83 | Temp 97.6°F | Resp 18 | Wt 195.1 lb

## 2024-08-21 DIAGNOSIS — M069 Rheumatoid arthritis, unspecified: Secondary | ICD-10-CM | POA: Diagnosis not present

## 2024-08-21 DIAGNOSIS — L02214 Cutaneous abscess of groin: Secondary | ICD-10-CM | POA: Diagnosis not present

## 2024-08-21 DIAGNOSIS — C779 Secondary and unspecified malignant neoplasm of lymph node, unspecified: Secondary | ICD-10-CM

## 2024-08-21 NOTE — Progress Notes (Signed)
 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  Anorectal area was examined by him and there were no suspicious lesions.  His last colonoscopy was in July 2024.   NGS molecular studies 07/25/2024 PET did not reveal primary sites.  In addition to hypermetabolic right groin, he has increased metabolic activity in the right external and common iliac nodal area, and mild activity in the retroperitoneal nodes. His case was discussed at tumor board. The additional iliac nodal area and retroperitoneal area hypermetabolism could be secondary to current infection versus cancer involvement.  Radiology oncology will not offer radiation until acute inflammation/infection has resolved.   I had a lengthy discussion with patient and his daughter in law who is a Alfred Thomas.  Discussed about the option of systemic immunotherapy. Rationale and side effects were reviewed with patient. The concern is that he has history of rheumatoid arthritis, currently off medication. There is certainly risk of symptomatic flare up. I have discussed with patient's rheumatologist Dr. Tobie who is ok with patient trying and recommend patient to resume plaquenil for symptom control.  Patient voices concerns of flaring RA symptoms and would like to hold off treatments.  Plan is to repeat a PET scan for re - evaluation now that his right groin infection has resolved.  Refer to Radonc for adjuvant radiation.   Abscess of right groin S/p I&D and antibiotic treatments. Clinically erythematous changes and swelling have resolved.   Rheumatoid arthritis (HCC) Follows up with Dr. Tobie. Diagnosed 06/2023; positive RF, Symptoms started in  September with bilateral shoulders pain, right wrist then left hip. Elevated ESR and CRP; multiple courses of prednisone currently he is off any medication and clinically doing well.       Orders Placed This Encounter  Procedures   NM PET Image Restag (PS) Skull Base To Thigh    Standing Status:   Future    Expected Date:   09/06/2024    Expiration Date:   08/21/2025    If indicated for the ordered procedure, I authorize the administration of a radiopharmaceutical per Radiology protocol:   Yes    Preferred imaging location?:   [facility]   Follow-up TBD All questions were answered. The patient knows to call the clinic with any problems, questions or concerns.  Zelphia Cap, MD, PhD [facility] Hematology Oncology 08/21/2024    HISTORY OF PRESENTING ILLNESS:  Alfred Svec. 74 y.o. male presents to establish care for Squamous cell carcinoma of lymph node I have reviewed his chart and materials related to his cancer extensively and collaborated history with the patient. Summary of oncologic history is as follows:  Discussed the use of AI scribe software for clinical note transcription with the patient, who gave verbal consent to proceed.   Oncology History  Squamous cell carcinoma of lymph node (HCC)  06/21/2024 Imaging   CT abdomen pelvis w wo contrast   1. Rounded enhancing right inguinal mass measuring 2.2 x 2.1 cm, suspicious finding in patient with melanoma history. Recommend tissue sampling and/or FDG PET scan. 2. No liver metastases. 3. These results will be called to the ordering clinician or representative by the radiologist assistant, and communication documented in the pacs or clario  dashboard   07/13/2024 Initial Diagnosis   Squamous cell carcinoma of lymph node  Approximately three months ago, he noticed a mass in his right groin, initially suspecting it to be a hernia. He sought an earlier appointment with his primary physician, who examined the mass and  referred him for further evaluation. The mass was excised, pathology showed metastatic squamous cell carcinoma. Moderately differentiated involving multiple matted lymph nodes.   . Received fresh are multiple fragments of tan, cauterized, firm tissue fragments aggregating to 5.1 x 3.7 x 1.8 cm. The largest and only intact lymph node candidate is 2.2 cm, a representative portion is submitted to flow in RPMI.  All remaining tissue fragments are disrupted, tan-yellow with chalky discoloration and possible lymphoid components admixed with fibroadipose tissue.   07/25/2024 Imaging   PET scan showed  1. Thick-rimmed fluid collection in the right groin measuring 3.9 x 4.6 cm with intensely hypermetabolic rim, concerning for abscess or infected postoperative collection. 2. Hypermetabolic right external and common iliac nodal disease with extension into the right anterior obturator space, with additional small hypermetabolic retroperitoneal nodes. 3. Small minimally metabolic axial lymph nodes are favored unrelated to the right iliac adenopathy. 4. No primary malignancy identified.    He has a history of melanoma, with previous lesions on his shoulder and back, treated at [facility]. Additionally, he has a history of squamous cell carcinoma on his right forearm, which was excised.  He is active, plays golf, and walks regularly. He has never smoked. No rectal bleeding or discomfort. Reports swelling in the right knee.  His rheumatoid arthritis, previously debilitating, was managed with prednisone, methotrexate, folic acid, and hydroxychloroquine for approximately one year. He tapered off prednisone to 5 mg and discontinued all immunosuppressive medications two months ago.    INTERVAL HISTORY Alfred Sainsbury. is a 74 y.o. male who has above history reviewed by me today presents for follow up visit for [facility] involving right inguinal nodes.   He reports feeling well today. He was seen by  dermatologist who mentioned to him that he did have right lower extremity SCC lesion which was removed a few year ago.  Right groin swelling and pain symptoms have improved and resolved.  Currently, his arthritis symptoms are controlled and he denies joint pain.  MEDICAL HISTORY:  Past Medical History:  Diagnosis Date   Allergy    Atrial fibrillation (HCC)    Cancer (HCC)    melanoma skin cancer shoulder   Coronary artery disease involving native coronary artery of native heart without angina pectoris    Diabetes mellitus without complication (HCC)    Diverticulitis    Dyslipidemia    H/O echocardiogram    History of melanoma    Hyperglycemia    Hyperlipidemia    Long-term use of immunosuppressant medication    Mass of right inguinal region    PAC (premature atrial contraction)    Palpitations    Rheumatoid arthritis involving multiple sites with positive rheumatoid factor (HCC)    Thrombocytopenia     SURGICAL HISTORY: Past Surgical History:  Procedure Laterality Date   COLONOSCOPY     x2; last 2016   COLONOSCOPY W/ POLYPECTOMY  03/01/2023   Norleen Kiang at [facility]   POLYPECTOMY     SENTINEL NODE BIOPSY Right 06/29/2024   Procedure: BIOPSY, LYMPH NODE, INGUINAL;  Surgeon: Tye Millet, DO;  Location: [facility] ORS;  Service: General;  Laterality: Right;   TONSILLECTOMY     as a child  WISDOM TOOTH EXTRACTION      SOCIAL HISTORY: Social History   Socioeconomic History   Marital status: Married    Spouse name: Not on file   Number of children: Not on file   Years of education: Not on file   Highest education level: Not on file  Occupational History   Not on file  Tobacco Use   Smoking status: Never   Smokeless tobacco: Never  Vaping Use   Vaping status: Never Used  Substance and Sexual Activity   Alcohol use: Yes    Comment: socially/rarely   Drug use: No    Comment: prescription drugs   Sexual activity: Not on file  Other Topics Concern   Not on file  Social  History Narrative   Not on file   Social Drivers of Health   Tobacco Use: Low Risk (08/21/2024)   Patient History    Smoking Tobacco Use: Never    Smokeless Tobacco Use: Never    Passive Exposure: Not on file  Financial Resource Strain: Low Risk  (06/26/2024)   Received from [facility]   Overall Financial Resource Strain (CARDIA)    Difficulty of Paying Living Expenses: Not hard at all  Food Insecurity: No Food Insecurity (07/13/2024)   Epic    Worried About Radiation Protection Practitioner of Food in the Last Year: Never true    Ran Out of Food in the Last Year: Never true  Transportation Needs: No Transportation Needs (07/13/2024)   Epic    Lack of Transportation (Medical): No    Lack of Transportation (Non-Medical): No  Physical Activity: Not on file  Stress: Not on file  Social Connections: Not on file  Intimate Partner Violence: Not At Risk (07/13/2024)   Epic    Fear of Current or Ex-Partner: No    Emotionally Abused: No    Physically Abused: No    Sexually Abused: No  Depression (PHQ2-9): Low Risk (07/13/2024)   Depression (PHQ2-9)    PHQ-2 Score: 0  Alcohol Screen: Not on file  Housing: Low Risk  (07/19/2024)   Received from [facility]   Epic    In the last 12 months, was there a time when you were not able to pay the mortgage or rent on time?: No    In the past 12 months, how many times have you moved where you were living?: 0    At any time in the past 12 months, were you homeless or living in a shelter (including now)?: No  Utilities: Not At Risk (07/13/2024)   Epic    Threatened with loss of utilities: No  Health Literacy: Not on file    FAMILY HISTORY: Family History  Problem Relation Age of Onset   Coronary artery disease Mother    Heart attack Mother    Heart disease Mother    Ovarian cancer Mother    Heart disease Father    Emphysema Father    Colon cancer Neg Hx    Colon polyps Neg Hx    Esophageal cancer Neg Hx    Rectal cancer  Neg Hx    Stomach cancer Neg Hx     ALLERGIES:  is allergic to rosuvastatin and atorvastatin.  MEDICATIONS:  Current Outpatient Medications  Medication Sig Dispense Refill   aspirin EC 81 MG tablet Take 81 mg by mouth in the morning.     diltiazem  (CARDIZEM  CD) 240 MG 24 hr capsule Take 240 mg by mouth in the morning.  Evolocumab  (REPATHA  SURECLICK) 140 MG/ML SOAJ INJECT 1 PEN. INTO THE SKIN EVERY 14 (FOURTEEN) DAYS. 6 mL 0   oxyCODONE -acetaminophen  (PERCOCET) 5-325 MG tablet Take 1 tablet by mouth every 8 (eight) hours as needed for severe pain (pain score 7-10). (Patient not taking: Reported on 08/21/2024) 6 tablet 0   No current facility-administered medications for this visit.    Review of Systems  Constitutional:  Negative for appetite change, chills, fatigue and fever.  HENT:   Negative for hearing loss and voice change.   Eyes:  Negative for eye problems.  Respiratory:  Negative for chest tightness and cough.   Cardiovascular:  Negative for chest pain.  Gastrointestinal:  Negative for abdominal distention, abdominal pain and blood in stool.  Endocrine: Negative for hot flashes.  Genitourinary:  Negative for difficulty urinating and frequency.   Musculoskeletal:  Negative for arthralgias.  Skin:  Negative for itching and rash.  Neurological:  Negative for extremity weakness.  Hematological:  Negative for adenopathy.  Psychiatric/Behavioral:  Negative for confusion.      PHYSICAL EXAMINATION: ECOG PERFORMANCE STATUS: 0 - Asymptomatic  Vitals:   08/21/24 0909  BP: 138/79  Pulse: 83  Resp: 18  Temp: 97.6 F (36.4 C)  SpO2: 99%   Filed Weights   08/21/24 0909  Weight: 195 lb 1.6 oz (88.5 kg)    Physical Exam Constitutional:      General: He is not in acute distress.    Appearance: He is not diaphoretic.  HENT:     Head: Normocephalic and atraumatic.  Eyes:     General: No scleral icterus. Cardiovascular:     Rate and Rhythm: Normal rate and regular  rhythm.  Pulmonary:     Effort: Pulmonary effort is normal. No respiratory distress.     Breath sounds: No wheezing.  Abdominal:     General: There is no distension.     Palpations: Abdomen is soft.     Tenderness: There is no abdominal tenderness.  Genitourinary:    Comments: Right groin erythematous changes and swelling have resolved. + scar tissue Musculoskeletal:        General: Normal range of motion.     Cervical back: Normal range of motion and neck supple.  Skin:    General: Skin is warm and dry.     Findings: No erythema.  Neurological:     Mental Status: He is alert and oriented to person, place, and time. Mental status is at baseline.     Motor: No abnormal muscle tone.  Psychiatric:        Mood and Affect: Mood and affect normal.      LABORATORY DATA:  I have reviewed the data as listed    Latest Ref Rng & Units 07/27/2024   10:01 AM 11/18/2021   12:39 PM 11/13/2021    7:52 AM  CBC  WBC 4.0 - 10.5 K/uL 5.5  7.6  CANCELED   Hemoglobin 13.0 - 17.0 g/dL 87.6  85.3  CANCELED   Hematocrit 39.0 - 52.0 % 37.9  42.7  CANCELED   Platelets 150 - 400 K/uL 153  151  CANCELED       Latest Ref Rng & Units 07/27/2024   10:01 AM 11/13/2021    7:52 AM 10/03/2019    8:10 AM  CMP  Glucose 70 - 99 mg/dL 882  885    BUN 8 - 23 mg/dL 11  11    Creatinine 9.38 - 1.24 mg/dL 9.02  8.96  Sodium 135 - 145 mmol/L 137  144    Potassium 3.5 - 5.1 mmol/L 4.1  4.7    Chloride 98 - 111 mmol/L 100  104    CO2 22 - 32 mmol/L 23  25    Calcium 8.9 - 10.3 mg/dL 9.5  9.0    Total Protein 6.5 - 8.1 g/dL 8.6  7.6  7.5   Total Bilirubin 0.0 - 1.2 mg/dL 0.6  0.6  0.5   Alkaline Phos 38 - 126 U/L 78  76  77   AST 15 - 41 U/L 17  20  28    ALT 0 - 44 U/L 8  13  24       RADIOGRAPHIC STUDIES: I have personally reviewed the radiological images as listed and agreed with the findings in the report. NM PET Image Initial (PI) Skull Base To Thigh (F-18 FDG) Result Date: 07/25/2024 EXAM: PET AND CT  SKULL BASE TO MID THIGH 07/25/2024 12:28:34 PM TECHNIQUE: RADIOPHARMACEUTICAL: 10.45 mCi F-18 FDG Uptake time 60 minutes. Glucose level 114 mg/dl. 10.45 mCi F-18 FDG was given by left AC IV at 11:09 am by SDH. PET imaging was acquired from the base of the skull to the mid thighs. Non-contrast enhanced computed tomography was obtained for attenuation correction and anatomic localization. COMPARISON: None available. CLINICAL HISTORY: Squamous cell carcinoma of lymph node. History of right forearm melanoma 3 years ago. NPO, No DM. 06/29/24 Biopsy of lymph node for lymphoma, right groin. Patient reports increasing redness and swelling in right groin post biopsy. FINDINGS: HEAD AND NECK: No metabolically active cervical lymphadenopathy. CHEST: No metabolically active pulmonary nodules. Hypermetabolic activity associated with small right axillary lymph node measuring max 3.0. This node is very small positioned beneath the pectoralis muscle measuring 5 mm on image 43.6. Similar Small lymph nodes on the left. Favor of these axillary nodes unrelated to the right groin iliac adenopathy. ABDOMEN AND PELVIS: There are hypermetabolic right external iliac nodes measuring 12 mm on image 153 with SUV max equal to 8.2. Hypermetabolic nodal activity extends into the anterior obturator space on the right. A second hypermetabolic right external iliac node measures 10 mm slightly more caudad with SUV max equals 6.1 on image 146. There is a hypermetabolic right common iliac node with SUV max of 7.2 on image 132. SABRAThere are several very small hypermetabolic retroperitoneal nodes positioned between the IVC and the aorta at the level of the kidneys. For example, a small node measuring 6 mm on image 108. Physiologic activity within the gastrointestinal and genitourinary systems. No abnormal activity in the liver. BONES AND SOFT TISSUE: No metabolically active aggressive osseous lesion. There is a well circumscribed fluid collection with  thickened rim in the right groin measuring 3.9 x 4.6 cm. This collection has an intensely hypermetabolic rim with SUV max equal to 8.7. IMPRESSION: 1. Thick-rimmed fluid collection in the right groin measuring 3.9 x 4.6 cm with intensely hypermetabolic rim, concerning for abscess or infected postoperative collection. 2. Hypermetabolic right external and common iliac nodal disease with extension into the right anterior obturator space, with additional small hypermetabolic retroperitoneal nodes. 3. Small minimally metabolic axial lymph nodes are favored unrelated to the right iliac adenopathy. 4. No primary malignancy identified. Electronically signed by: Norleen Boxer MD 07/25/2024 01:00 PM EST RP Workstation: [id number]    "

## 2024-08-21 NOTE — Assessment & Plan Note (Signed)
 Follows up with Dr. Tobie. Diagnosed 06/2023; positive RF, Symptoms started in September with bilateral shoulders pain, right wrist then left hip. Elevated ESR and CRP; multiple courses of prednisone currently he is off any medication and clinically doing well.

## 2024-08-21 NOTE — Assessment & Plan Note (Signed)
 Squamous cell carcinoma of right inguinal lymph node s/p excisional biopsy.  Anorectal area was examined by him and there were no suspicious lesions.  His last colonoscopy was in July 2024.   NGS molecular studies 07/25/2024 PET did not reveal primary sites.  In addition to hypermetabolic right groin, he has increased metabolic activity in the right external and common iliac nodal area, and mild activity in the retroperitoneal nodes. His case was discussed at tumor board. The additional iliac nodal area and retroperitoneal area hypermetabolism could be secondary to current infection versus cancer involvement.  Radiology oncology will not offer radiation until acute inflammation/infection has resolved.   I had a lengthy discussion with patient and his daughter in law who is a [employer].  Discussed about the option of systemic immunotherapy. Rationale and side effects were reviewed with patient. The concern is that he has history of rheumatoid arthritis, currently off medication. There is certainly risk of symptomatic flare up. I have discussed with patient's rheumatologist Dr. Tobie who is ok with patient trying and recommend patient to resume plaquenil for symptom control.  Patient voices concerns of flaring RA symptoms and would like to hold off treatments.  Plan is to repeat a PET scan for re - evaluation now that his right groin infection has resolved.  Refer to Radonc for adjuvant radiation.

## 2024-08-21 NOTE — Assessment & Plan Note (Signed)
 S/p I&D and antibiotic treatments. Clinically erythematous changes and swelling have resolved.

## 2024-09-05 ENCOUNTER — Ambulatory Visit
Admission: RE | Admit: 2024-09-05 | Discharge: 2024-09-05 | Disposition: A | Discharge status: Home / Self Care | Source: Ambulatory Visit | Attending: Oncology | Admitting: Oncology

## 2024-09-05 DIAGNOSIS — I700 Atherosclerosis of aorta: Secondary | ICD-10-CM | POA: Diagnosis not present

## 2024-09-05 DIAGNOSIS — N186 End stage renal disease: Secondary | ICD-10-CM | POA: Diagnosis not present

## 2024-09-05 DIAGNOSIS — I7143 Infrarenal abdominal aortic aneurysm, without rupture: Secondary | ICD-10-CM | POA: Diagnosis not present

## 2024-09-05 DIAGNOSIS — E1122 Type 2 diabetes mellitus with diabetic chronic kidney disease: Secondary | ICD-10-CM | POA: Insufficient documentation

## 2024-09-05 DIAGNOSIS — C779 Secondary and unspecified malignant neoplasm of lymph node, unspecified: Secondary | ICD-10-CM

## 2024-09-05 DIAGNOSIS — Z992 Dependence on renal dialysis: Secondary | ICD-10-CM | POA: Diagnosis not present

## 2024-09-05 DIAGNOSIS — C774 Secondary and unspecified malignant neoplasm of inguinal and lower limb lymph nodes: Secondary | ICD-10-CM | POA: Insufficient documentation

## 2024-09-05 LAB — GLUCOSE, CAPILLARY: Glucose-Capillary: 93 mg/dL (ref 70–99)

## 2024-09-05 MED ORDER — FLUDEOXYGLUCOSE F - 18 (FDG) INJECTION
10.7400 | Freq: Once | INTRAVENOUS | Status: AC | PRN
  Administered 2024-09-05: 10.74 via INTRAVENOUS

## 2024-09-10 ENCOUNTER — Ambulatory Visit: Payer: Self-pay | Admitting: Oncology

## 2024-09-11 ENCOUNTER — Other Ambulatory Visit: Payer: Self-pay

## 2024-09-11 ENCOUNTER — Telehealth: Payer: Self-pay | Admitting: *Deleted

## 2024-09-11 DIAGNOSIS — C779 Secondary and unspecified malignant neoplasm of lymph node, unspecified: Secondary | ICD-10-CM

## 2024-09-11 NOTE — Telephone Encounter (Signed)
 Referral placed to Rad onc. Re: Squamous cell carcinoma of lymph node   Will you please contact pt to schedule.

## 2024-09-11 NOTE — Progress Notes (Signed)
 Pt called triage and Heather H relayed message to patient. Referral to rad onc placed.

## 2024-09-11 NOTE — Telephone Encounter (Signed)
 Patient calling to discuss his oncology treatment plan. Caller verified using pt's full name and dob prior to discussing PHI. Patient had a pet scan last wed for the 2nd time and he would like to know a update on results. Pt stated that he was anxious and upset and frustrated that he has not heard about his results. I explained to the patient that the results were read on Saturday. I explained to him that our office was not opened yesterday Please let patient know that repeat PET scan showed decreased lymph node activity, consistent with treated infection. Please refer patient to establish care with radiation oncology for evaluation. Please put him on TBD list to review his chart in 4 to 6 weeks. -Thank you. Pt still expressed that he didn't understand why he hadn't heard anything. I reiterated to him multiple times that the pet scan was just read and the team was planning to reach out to him today. I made patient aware that per Dr. Babara there was noted improvement. He still be referred to radiation oncology. Pt gave verbal understanding.

## 2024-09-20 ENCOUNTER — Ambulatory Visit: Admitting: Radiation Oncology
